# Patient Record
Sex: Female | Born: 1960 | Race: White | Hispanic: No | Marital: Single | Smoking: Current every day smoker
Health system: Southern US, Community
[De-identification: ages and names within clinical notes are randomized; demographics above are authoritative.]

## PROBLEM LIST (undated history)

## (undated) ENCOUNTER — Ambulatory Visit: Source: Home / Self Care

## (undated) DIAGNOSIS — Z9889 Other specified postprocedural states: Secondary | ICD-10-CM

## (undated) DIAGNOSIS — R3915 Urgency of urination: Secondary | ICD-10-CM

## (undated) DIAGNOSIS — Z87442 Personal history of urinary calculi: Secondary | ICD-10-CM

## (undated) DIAGNOSIS — M1990 Unspecified osteoarthritis, unspecified site: Secondary | ICD-10-CM

## (undated) DIAGNOSIS — T8859XA Other complications of anesthesia, initial encounter: Secondary | ICD-10-CM

## (undated) DIAGNOSIS — F319 Bipolar disorder, unspecified: Secondary | ICD-10-CM

## (undated) DIAGNOSIS — T4145XA Adverse effect of unspecified anesthetic, initial encounter: Secondary | ICD-10-CM

## (undated) DIAGNOSIS — Z973 Presence of spectacles and contact lenses: Secondary | ICD-10-CM

## (undated) DIAGNOSIS — Z85820 Personal history of malignant melanoma of skin: Secondary | ICD-10-CM

## (undated) DIAGNOSIS — F419 Anxiety disorder, unspecified: Secondary | ICD-10-CM

## (undated) DIAGNOSIS — E119 Type 2 diabetes mellitus without complications: Secondary | ICD-10-CM

## (undated) DIAGNOSIS — E785 Hyperlipidemia, unspecified: Secondary | ICD-10-CM

## (undated) DIAGNOSIS — N289 Disorder of kidney and ureter, unspecified: Secondary | ICD-10-CM

## (undated) DIAGNOSIS — I10 Essential (primary) hypertension: Secondary | ICD-10-CM

## (undated) DIAGNOSIS — Z960 Presence of urogenital implants: Secondary | ICD-10-CM

---

## 1993-08-08 HISTORY — PX: TUBAL LIGATION: SHX77

## 1993-08-08 HISTORY — PX: LUMBAR MICRODISCECTOMY: SHX99

## 1994-08-08 HISTORY — PX: CARPAL TUNNEL RELEASE: SHX101

## 2005-02-24 ENCOUNTER — Other Ambulatory Visit: Admission: RE | Admit: 2005-02-24 | Discharge: 2005-02-24 | Payer: Self-pay | Admitting: Internal Medicine

## 2005-03-15 ENCOUNTER — Other Ambulatory Visit: Admission: RE | Admit: 2005-03-15 | Discharge: 2005-03-15 | Payer: Self-pay | Admitting: Obstetrics and Gynecology

## 2005-05-13 ENCOUNTER — Emergency Department (HOSPITAL_COMMUNITY): Admission: EM | Admit: 2005-05-13 | Discharge: 2005-05-13 | Payer: Self-pay | Admitting: Family Medicine

## 2005-08-23 ENCOUNTER — Ambulatory Visit (HOSPITAL_COMMUNITY): Admission: RE | Admit: 2005-08-23 | Discharge: 2005-08-23 | Payer: Self-pay | Admitting: Internal Medicine

## 2005-09-01 ENCOUNTER — Emergency Department (HOSPITAL_COMMUNITY): Admission: EM | Admit: 2005-09-01 | Discharge: 2005-09-02 | Payer: Self-pay | Admitting: Emergency Medicine

## 2005-09-02 ENCOUNTER — Ambulatory Visit (HOSPITAL_COMMUNITY): Admission: RE | Admit: 2005-09-02 | Discharge: 2005-09-02 | Payer: Self-pay | Admitting: Dermatology

## 2005-09-05 ENCOUNTER — Ambulatory Visit (HOSPITAL_COMMUNITY): Admission: RE | Admit: 2005-09-05 | Discharge: 2005-09-05 | Payer: Self-pay | Admitting: Internal Medicine

## 2007-07-01 ENCOUNTER — Emergency Department (HOSPITAL_COMMUNITY): Admission: EM | Admit: 2007-07-01 | Discharge: 2007-07-01 | Payer: Self-pay | Admitting: Emergency Medicine

## 2008-02-17 ENCOUNTER — Other Ambulatory Visit: Payer: Self-pay | Admitting: Emergency Medicine

## 2008-02-18 ENCOUNTER — Other Ambulatory Visit: Payer: Self-pay | Admitting: Emergency Medicine

## 2008-02-18 ENCOUNTER — Ambulatory Visit: Payer: Self-pay | Admitting: Psychiatry

## 2008-02-18 ENCOUNTER — Inpatient Hospital Stay (HOSPITAL_COMMUNITY): Admission: AD | Admit: 2008-02-18 | Discharge: 2008-02-22 | Payer: Self-pay | Admitting: Psychiatry

## 2008-03-04 ENCOUNTER — Encounter: Admission: RE | Admit: 2008-03-04 | Discharge: 2008-03-04 | Payer: Self-pay | Admitting: Family Medicine

## 2008-12-05 ENCOUNTER — Emergency Department (HOSPITAL_COMMUNITY): Admission: EM | Admit: 2008-12-05 | Discharge: 2008-12-05 | Payer: Self-pay | Admitting: Emergency Medicine

## 2009-01-12 ENCOUNTER — Ambulatory Visit (HOSPITAL_COMMUNITY): Admission: RE | Admit: 2009-01-12 | Discharge: 2009-01-12 | Payer: Self-pay | Admitting: Obstetrics & Gynecology

## 2009-01-15 ENCOUNTER — Inpatient Hospital Stay (HOSPITAL_COMMUNITY): Admission: AD | Admit: 2009-01-15 | Discharge: 2009-01-15 | Payer: Self-pay | Admitting: Obstetrics & Gynecology

## 2009-02-06 ENCOUNTER — Inpatient Hospital Stay (HOSPITAL_COMMUNITY): Admission: RE | Admit: 2009-02-06 | Discharge: 2009-02-09 | Payer: Self-pay | Admitting: Obstetrics & Gynecology

## 2009-02-06 ENCOUNTER — Encounter: Payer: Self-pay | Admitting: Obstetrics & Gynecology

## 2009-02-06 HISTORY — PX: TOTAL ABDOMINAL HYSTERECTOMY W/ BILATERAL SALPINGOOPHORECTOMY: SHX83

## 2009-05-14 IMAGING — CT CT PELVIS W/ CM
2 of 5 series · 17 of 46 positions shown, 19 images · IV contrast (READICAT/WATER & [ID] OMNI [id number])
Comparison: None available

CT ABDOMEN

CLINICAL DATA: Low abdominal pain

CT ABDOMEN AND PELVIS WITH CONTRAST
TECHNIQUE: Multidetector CT imaging of the abdomen and pelvis was
performed using the standard protocol following bolus
administration of intravenous contrast.
Contrast: 125 ml Lmnipaque-KKK IV

[Series 3: routine abdomen · axial · 0.80mm/px · z∈[-410,+40]mm · 14 of 99 slices shown, 16 images]
[im 6/99  soft-tissue]
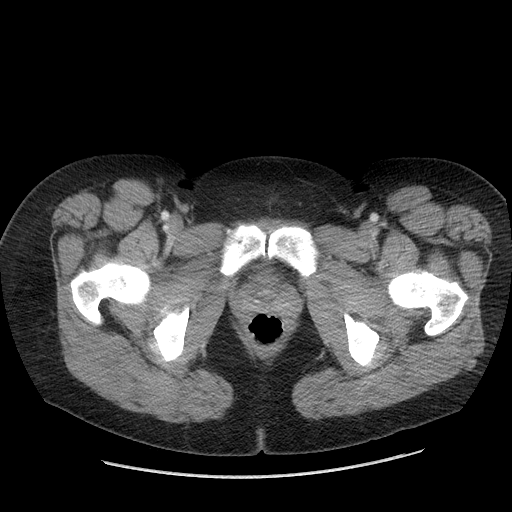
[im 6/99  bone]
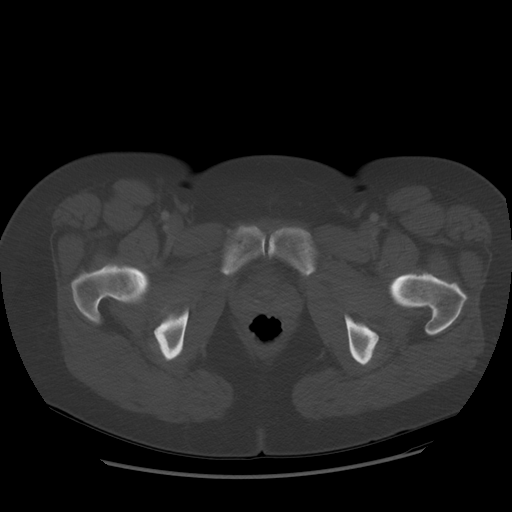
[im 11/99  soft-tissue]
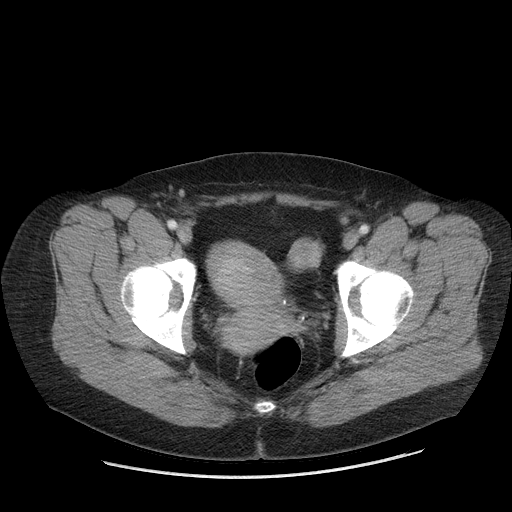
[im 21/99  soft-tissue]
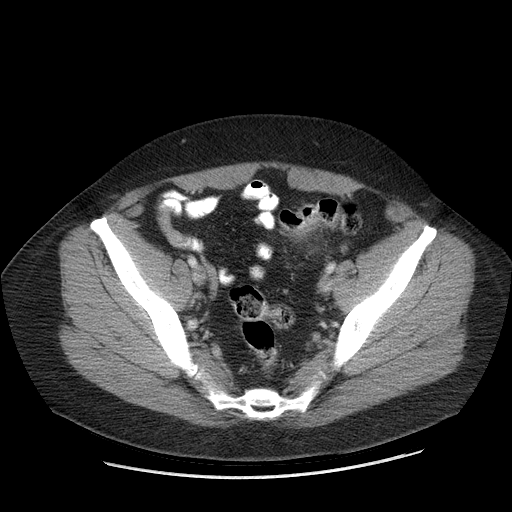
[im 26/99  soft-tissue]
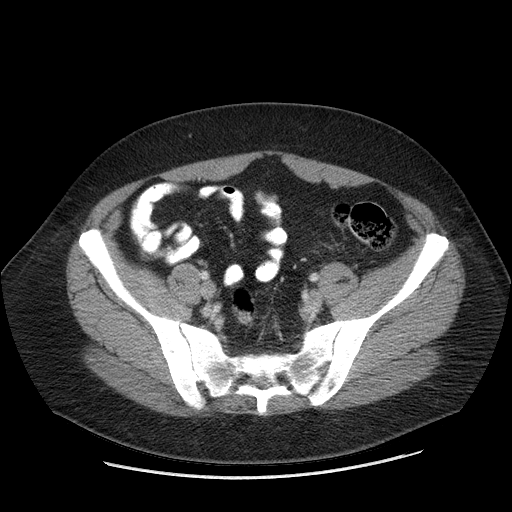
[im 31/99  soft-tissue]
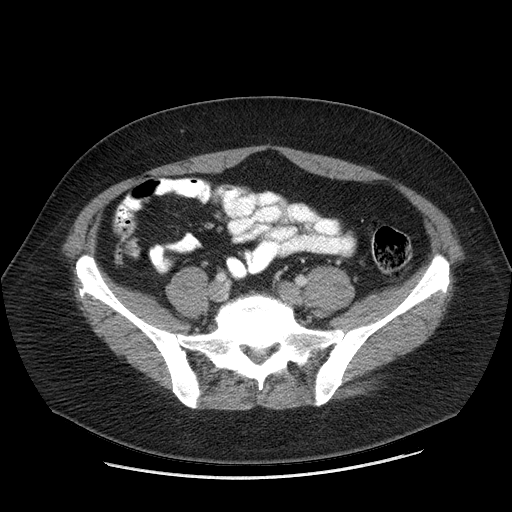
[im 42/99  soft-tissue]
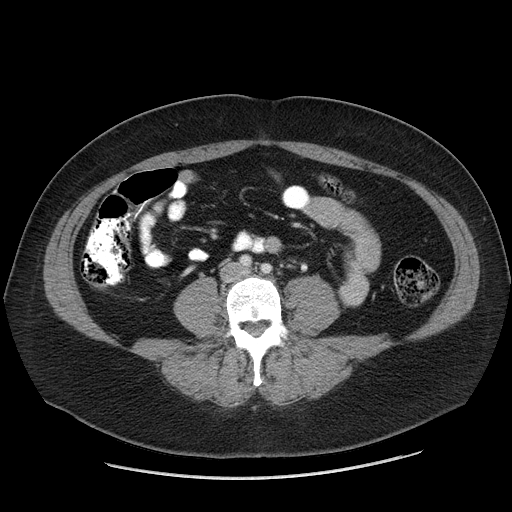
[im 47/99  soft-tissue]
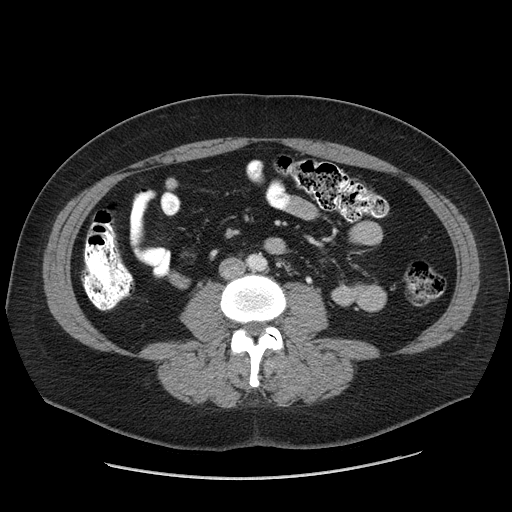
[im 52/99  soft-tissue]
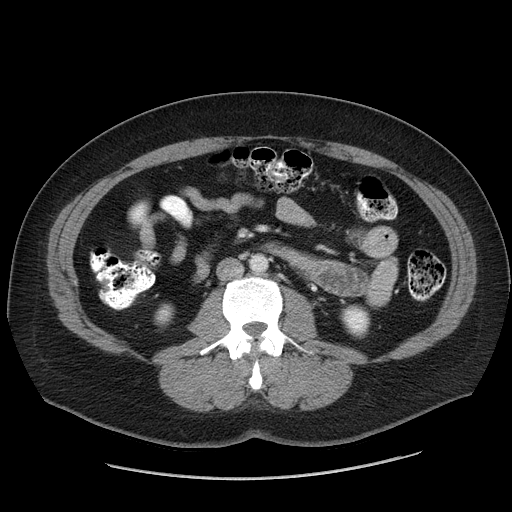
[im 57/99  soft-tissue]
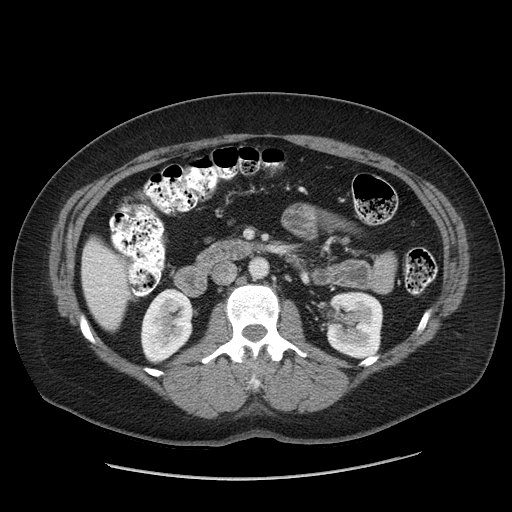
[im 57/99  bone]
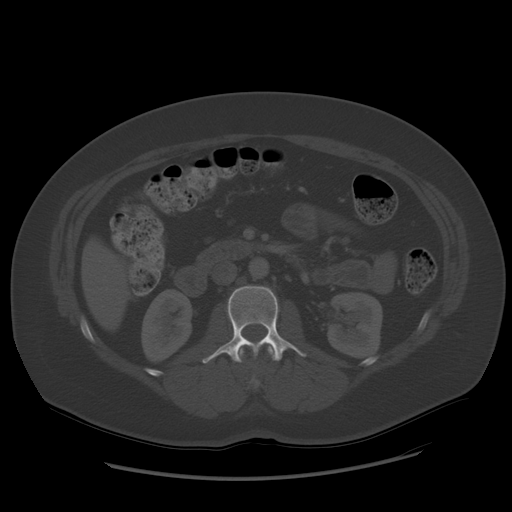
[im 68/99  soft-tissue]
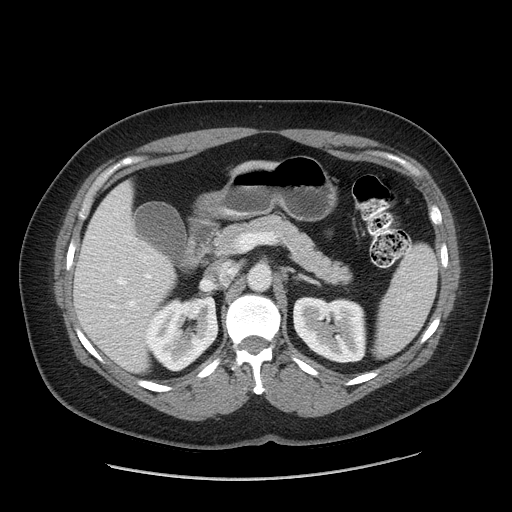
[im 73/99  soft-tissue]
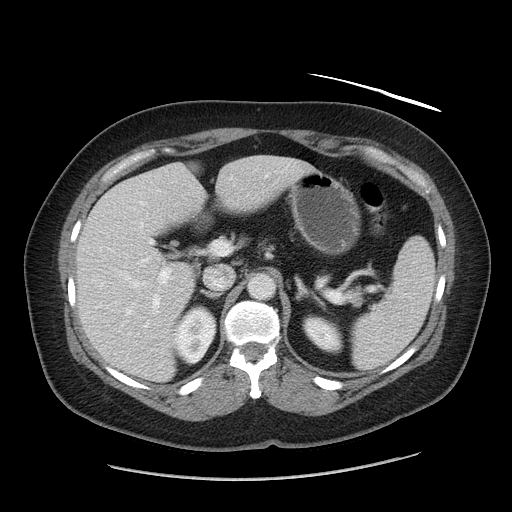
[im 78/99  soft-tissue]
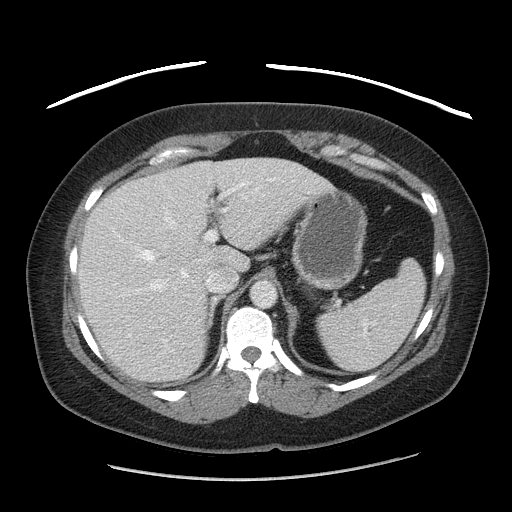
[im 88/99  soft-tissue]
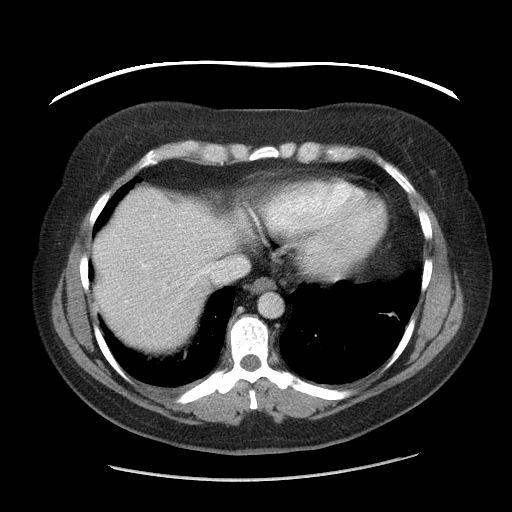
[im 93/99  soft-tissue]
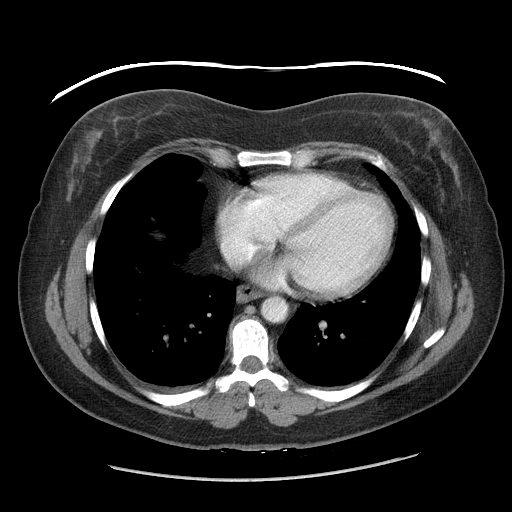

[Series 602: sagittal body · sagittal · 1.06mm/px · 3 of 165 slices shown]
[im 55/165  soft-tissue]
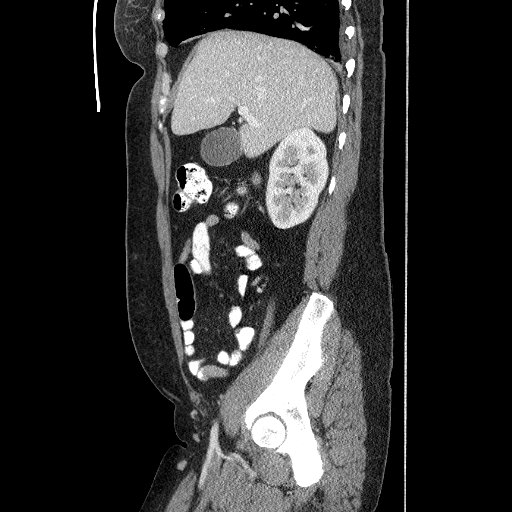
[im 73/165  soft-tissue]
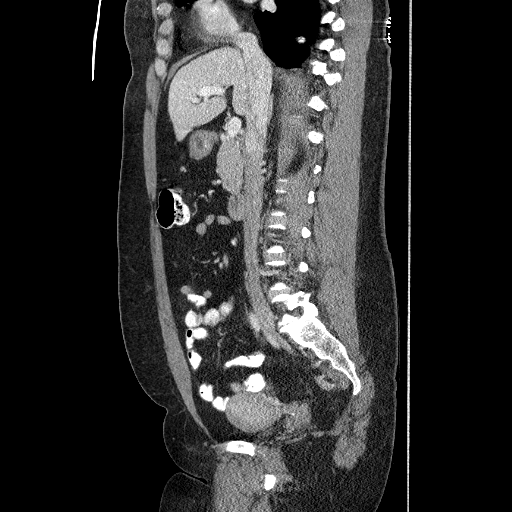
[im 92/165  soft-tissue]
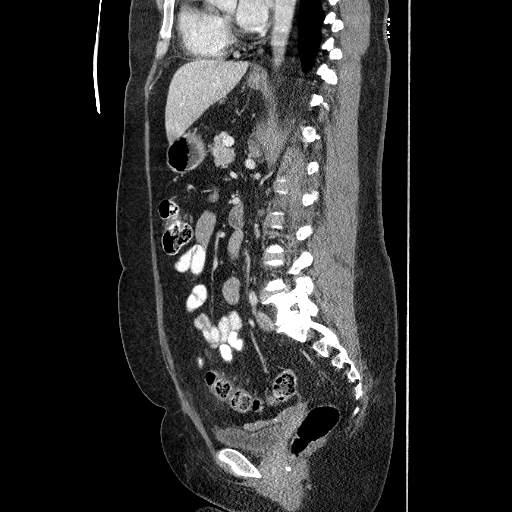

[17 of 46 positions shown; findings below may reference images not displayed]

FINDINGS: Minimal dependent atelectasis posteriorly in both lung
bases.  There are tiny bilateral pleural effusions.  Unremarkable
liver, gallbladder, spleen, adrenal glands, pancreas, kidneys,
aorta, small bowel.  No free air.  No ascites.  Portal vein patent.
A few sub centimeter left para-aortic and aortocaval lymph nodes
are noted, none appear pathologically enlarged by CT size criteria.
Degenerative changes at the lumbosacral junction.
IMPRESSION: 1.  Negative for acute abdominal process

CT PELVIS
FINDINGS: Normal appendix.  The colon is nondilated.
Inflammatory/edematous changes around the descending/sigmoid
junction.  The colon here is incompletely distended with some
asymmetric wall thickening.  No discrete extraluminal fluid
collection or gas.  The more distal sigmoid colon is nondistended,
unremarkable.  Urinary bladder is incompletely distended.  Uterus
and right adnexal region unremarkable.  2 left ovarian cysts are
noted.  No free fluid.  Small bilateral pelvic phleboliths.  No
adenopathy.
IMPRESSION: 1.  Probable diverticulitis at the descending/sigmoid junction
without evidence of abscess. Follow up recommended after resolution
to exclude mucosal lesion.
2.  Normal appendix

## 2009-05-18 ENCOUNTER — Emergency Department (HOSPITAL_COMMUNITY): Admission: EM | Admit: 2009-05-18 | Discharge: 2009-05-18 | Payer: Self-pay | Admitting: Emergency Medicine

## 2010-05-18 ENCOUNTER — Emergency Department (HOSPITAL_COMMUNITY): Admission: EM | Admit: 2010-05-18 | Discharge: 2010-05-18 | Payer: Self-pay | Admitting: Emergency Medicine

## 2010-11-14 LAB — TYPE AND SCREEN

## 2010-11-14 LAB — BASIC METABOLIC PANEL
BUN: 6 mg/dL (ref 6–23)
Calcium: 8 mg/dL — ABNORMAL LOW (ref 8.4–10.5)
GFR calc non Af Amer: >60 mL/min (ref >60)
Glucose, Bld: 139 mg/dL — ABNORMAL HIGH (ref 70–99)
Sodium: 137 mEq/L (ref 135–145)

## 2010-11-14 LAB — CBC
HCT: 38.1 % (ref 36.0–46.0)
Hemoglobin: 12.1 g/dL (ref 12.0–15.0)
Hemoglobin: 13.4 g/dL (ref 12.0–15.0)
Platelets: 176 10*3/uL (ref 150–400)
Platelets: 191 10*3/uL (ref 150–400)
RBC: 4.11 MIL/uL (ref 3.87–5.11)
RDW: 12.8 % (ref 11.5–15.5)
WBC: 7.8 10*3/uL (ref 4.0–10.5)

## 2010-11-14 LAB — PREGNANCY, URINE: Preg Test, Ur: NEGATIVE

## 2010-11-14 LAB — ABO/RH: ABO/RH(D): B POS

## 2010-11-15 LAB — CBC
Hemoglobin: 12.4 g/dL (ref 12.0–15.0)
RBC: 3.72 MIL/uL — ABNORMAL LOW (ref 3.87–5.11)

## 2010-11-15 LAB — SAMPLE TO BLOOD BANK

## 2010-11-15 LAB — GC/CHLAMYDIA PROBE AMP, GENITAL: GC Probe Amp, Genital: NEGATIVE

## 2010-11-15 LAB — WET PREP, GENITAL
Trich, Wet Prep: NONE SEEN
Yeast Wet Prep HPF POC: NONE SEEN

## 2010-12-21 NOTE — H&P (Signed)
[name], [name]                  ACCOUNT NO.:  [id number]   MEDICAL RECORD NO.:  [id number]          PATIENT TYPE:  MAT   LOCATION:  MATC                          FACILITY:  [facility]   PHYSICIAN:  Roseanna Rainbow, M.D.DATE OF BIRTH:  09-Jul-1961   DATE OF ADMISSION:  01/15/2009  DATE OF DISCHARGE:  01/15/2009                              HISTORY & PHYSICAL   CHIEF COMPLAINT:  The patient is a 50 year old, complaining of abnormal  uterine bleeding.  She has also been previously diagnosed with  endometriosis and has associated pelvic pain.  She presents for a total  abdominal hysterectomy and bilateral salpingo-oophorectomy.   HISTORY OF PRESENT ILLNESS:  The patient reports menstrual cycles at 3-4  months intervals.  She also reports prolonged menses 2-3 weeks in  duration.  She reports associated heavy flow with blood clots.  She has  a remote history of a laparoscopy at which point, per the patient's  report she was diagnosed with endometriosis.  She also complains of  dyspareunia.  Workup to date included an endometrial biopsy and an  ultrasound which showed a possible small fundal myoma and a right simple  appearing 3 cm ovarian cyst. GC and chlamydia probes were negative on  June 2 as well as a CA-125 was within normal limits.  A hemoglobin was  12.8, a prolactin was 7.5 and a TSH was within normal limits.   ALLERGIES:  No known drug allergies.   MEDICATIONS:  Paxil, Neurontin, Lamictal, trazodone.   PAST MEDICAL HISTORY:  Depression, hypercholesterolemia.   PAST SURGICAL HISTORY:  Laparoscopy, back surgery, hand surgery, skin  lesion removal excision.   SOCIAL HISTORY:  She is divorced, living with her significant other.  She is a Consulting civil engineer.  She currently smokes one pack a day and has smoked  for 30-40 years.  She drinks a minimal amount of alcohol.   FAMILY HISTORY:  Remarkable for Alzheimer disease, asthma, arthritis,  lung cancer, myocardial infarction, COPD,  depression, adult-onset  diabetes, gout, hypertension, migraine headaches.   PAST GYN HISTORY:  Please see the above.  Last Pap smear was earlier in  the year and was normal.  She has a history of a cesarean delivery and  tubal ligation.   REVIEW OF SYSTEMS:  GU:  Please see the above.  GENERAL:  She reports a  recent weight gain.   PHYSICAL EXAMINATION:  VITAL SIGNS:  Temperature 97.9, pulse 72, blood  pressure 112/72, weight 242 pounds.  GENERAL:  A well-developed, well-nourished, no apparent distress.  LUNGS:  Clear to auscultation bilaterally.  HEART:  Regular rate and rhythm.  ABDOMEN:  Nontender.  No organomegaly.  No hernias.  Pelvic exam normal  exit, vagina is clean.  The uterus is nontender, is of  normal size,  shape and consistency.  Position and ability are normal.  On exam of the  adnexa there is bilateral adnexal tenderness appreciated.  No discrete  masses noted.   ASSESSMENT:  Dysfunctional uterine bleeding likely perimenopausal also  with a chronic pelvic pain syndrome, previously diagnosed with  endometriosis.   PLAN:  The planned procedure is a total abdominal hysterectomy and  bilateral salpingo-oophorectomy.  The risks, benefits and alternatives,  and forms of management were reviewed with the patient and informed  consent has been obtained.      Roseanna Rainbow, M.D.  Electronically Signed     LAJ/MEDQ  D:  02/05/2009  T:  02/06/2009  Job:  [id number]

## 2010-12-21 NOTE — Op Note (Signed)
NAME:  [name], [name]                  ACCOUNT NO.:  [id number]   MEDICAL RECORD NO.:  [id number]          PATIENT TYPE:  INP   LOCATION:  9373                          FACILITY:  [facility]   PHYSICIAN:  Roseanna Rainbow, M.D.DATE OF BIRTH:  March 25, 1961   DATE OF PROCEDURE:  02/06/2009  DATE OF DISCHARGE:                               OPERATIVE REPORT   PREOPERATIVE DIAGNOSES:  Chronic pelvic pain, abnormal uterine bleeding,  uterine fibroids, right-sided ovarian cyst.   POSTOPERATIVE DIAGNOSES:  Chronic pelvic pain, abnormal uterine  bleeding, uterine fibroids, right-sided ovarian cyst.   PROCEDURE:  Total abdominal hysterectomy and bilateral salpingo-  oophorectomy.   SURGEON:  Roseanna Rainbow, MD   ASSISTANT:  Bing Neighbors. Clearance Coots, MD   ANESTHESIA:  General endotracheal.   ESTIMATED BLOOD LOSS:  200 mL.   SURGICAL FINDINGS:  At that time of exploratory laparotomy, there was a  small fundal myoma.  There was a hydrosalpinx noted on the right side  distal to the previous ligation procedure.  There was a simple right-  sided ovarian cyst that was ruptured intraoperatively.  During the  procedure the patient demonstrated generalized tonic-clonic movement.  These were episodic and brief in duration lasting less than 1 minute.   PROCEDURE:  The patient was taken to the operating room.  She was given  general anesthesia and placed in a modified lithotomy position with an  Allis stirrup.  The abdomen was prepped and draped in the usual sterile  fashion.  After a time-out had been completed, the abdomen was entered  through the previous transverse skin incision.  The abdomen and pelvis  were explored with the above-noted findings.  An O'Connor-O'Sullivan  retractor was placed into the incision.  The bowel was packed away with  moist laparotomy sponges.  The uterus was grasped with 2 Lizarraga Kelly  clamps.  The round ligaments were divided.  The retroperitoneal spaces  was opened  identifying the ureters.  The ovarian pedicles were  skeletonized, clamped, cut, free tied, and suture-ligated.  The bladder  flap was then advanced using sharp and blunt dissection.  The uterine  vessels were skeletonized, clamped, cut, and suture-ligated.  In a step-  wise fashion, the cardinal ligaments were clamped, cut, and suture-  ligated.  The vaginal angles were cross clamped and the vagina was  transected from the cervix.  The vaginal angles were transfixed with 0  Vicryl sutures.  The remainder of the vaginal cuff was closed with  interrupted sutures of 0 Vicryl.  The pelvis was irrigated and found to  be hemostatic.  The retractor and packs were then removed from the  abdomen.  The parietal peritoneum was then reapproximated in a running  fashion using a suture of 2-0 Vicryl.  The fascia was reapproximated  with 2  running sutures of 0 PDS tied in the midline.  The skin was closed in a  subcuticular fashion using a running suture of 3-0 Monocryl.  At the  close of the procedure, the instrument and pack counts were said to be  correct x2.  The  patient was taken to the PACU awake and in stable  condition.      Roseanna Rainbow, M.D.  Electronically Signed     LAJ/MEDQ  D:  02/06/2009  T:  02/07/2009  Job:  [id number]

## 2010-12-24 NOTE — Discharge Summary (Signed)
[name], [name]                  ACCOUNT NO.:  [id number]   MEDICAL RECORD NO.:  [id number]          PATIENT TYPE:  IPS   LOCATION:  0307                          FACILITY:  [facility]   PHYSICIAN:  Geoffery Lyons, M.D.      DATE OF BIRTH:  [birth year]   DATE OF ADMISSION:  02/18/2008  DATE OF DISCHARGE:  02/22/2008                               DISCHARGE SUMMARY   CHIEF COMPLAINT:  This was the first admission to [facility] for this 50 year old female voluntarily admitted.  Was fired from  her job.  Endorsed increased depression and being tearful, wanted to end  her life. Had been tearful with decreased energy, sleeping 13 hours a  night, diagnosed bipolar 2 years prior to this admission.  Mood  fluctuation throughout the day. No appetite, increased crying, suicidal  thoughts, drinking about a case a week.   PAST PSYCHIATRIC HISTORY:  Dr. Betti Cruz in the past.  No current care.  [facility]. Previously diagnosed bipolar on Lamictal,  took up to 100 mg per day. Two prior suicide attempts age 66 and in 43  by cutting her wrist and overdose in 1997.   SECONDARY HISTORY:  As already stated, persistent use of alcohol.   MEDICAL HISTORY:  Noncontributory.   MEDICATION:  1. Paxil 20 mg per day.  2. Klonopin 1 mg two at night.   PHYSICAL EXAMINATION:  Exam failed to show any acute findings.   LABORATORY WORK:  TSH 2.141.  Other labs not available in the chart.   MENTAL STATUS EXAM:  Reveals a fully alert, cooperative female with  depressed affect, depressed, tearful.  Thought processes logical,  coherent and relevant.  Endorsed suicidal ruminations. No active plan.  No homicidal ideas, no delusions.  No hallucinations.  Cognition well-  preserved.   ADMITTING DIAGNOSES:  AXIS I: Bipolar disorder, depressed. Alcohol  disorder.  AXIS II: No diagnosis.  AXIS III:  No diagnosis.  AXIS IV: Moderate.  AXIS V:  Upon admission 35-40, highest GAF in the last  year 60.   COURSE IN THE HOSPITAL:  She was admitted.  She was started individual  and group psychotherapy.  We detoxified with Librium. She was placed on  Paxil and Lamictal. As already stated, endorsed she got depressed.  She  claimed that she kept her boss lady from driving while intoxicated.  Endorsed that she can be up in the morning then she goes down.  She can  relate that for 2 or 3 days, decreased appetite, increased sleep,  crying, decreased energy, decreased motivation.  She was diagnosed 2  years ago via Dr. Betti Cruz as bipolar. Had also seen Dr. Elberta Spaniel for  counseling. Attempted suicide at the age 27 and then in 50. Living  with the boyfriend. Strong family history of depression.  July 15, she  had not been sleeping well. She said she was up in the middle of the  night.  Gets up for a smoke and smokes a cigarette and then goes back.  Also going to AA.  She was  admitted that she had problems with alcohol.  Endorsed that she was committed to abstinence. Endorsed that she would  change jobs. 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 July 16, she wanted help with the anxiety if  she is not going to able to get the benzodiazepines.  Endorsed that  panicky feeling. We tried on Neurontin. Family session with boyfriend.  She was endorsing that she wanted full abstinence. July 17 she was in  full contact reality.  Fully detoxed. No suicidal  ideas, no  hallucinations or delusions.  Mood improved.  Affect brighter,  tolerating medication well.   DISCHARGE DIAGNOSES:  AXIS I: Bipolar disorder, anxiety disorder, not  otherwise specified. Alcohol abuse.  AXIS II: No diagnosis.  AXIS III:  No diagnosis.  AXIS IV: Moderate.  AXIS V:  Upon discharge 55-60.   Discharged on Lamictal 25 one daily for 10 more days then two daily.  Paxil 30 mg per day, Neurontin 100 three times a day, BuSpar 15 one-half  twice a day and trazodone 100 one-half  to one  at bedtime for sleep.  Follow with Darryll Capers and Silver Huguenin.      Geoffery Lyons, M.D.  Electronically Signed     IL/MEDQ  D:  03/19/2008  T:  03/19/2008  Job:  [id number]

## 2011-05-05 LAB — DIFFERENTIAL
Basophils Relative: 1
Eosinophils Absolute: 0
Monocytes Absolute: 0.4
Monocytes Relative: 5
Neutrophils Relative %: 45

## 2011-05-05 LAB — URINALYSIS, ROUTINE W REFLEX MICROSCOPIC
Glucose, UA: NEGATIVE
Hgb urine dipstick: NEGATIVE
Ketones, ur: NEGATIVE
Protein, ur: NEGATIVE

## 2011-05-05 LAB — COMPREHENSIVE METABOLIC PANEL
AST: 20
Albumin: 3.7
Chloride: 103
Creatinine, Ser: 0.8
GFR calc Af Amer: >60
Potassium: 3.6
Total Bilirubin: 0.6
Total Protein: 6.6

## 2011-05-05 LAB — ETHANOL: Alcohol, Ethyl (B): 42 — ABNORMAL HIGH

## 2011-05-05 LAB — CBC
MCV: 89.6
Platelets: 196
WBC: 7

## 2011-05-05 LAB — RAPID URINE DRUG SCREEN, HOSP PERFORMED
Amphetamines: NOT DETECTED
Benzodiazepines: NOT DETECTED

## 2011-05-05 LAB — T4, FREE: Free T4: 1.01

## 2012-09-10 ENCOUNTER — Emergency Department: Payer: Self-pay | Admitting: Emergency Medicine

## 2012-10-29 ENCOUNTER — Emergency Department (HOSPITAL_COMMUNITY)
Admission: EM | Admit: 2012-10-29 | Discharge: 2012-10-29 | Disposition: A | Discharge status: Home / Self Care | Payer: Self-pay | Attending: Emergency Medicine | Admitting: Emergency Medicine

## 2012-10-29 ENCOUNTER — Encounter (HOSPITAL_COMMUNITY): Payer: Self-pay | Admitting: Emergency Medicine

## 2012-10-29 ENCOUNTER — Emergency Department (HOSPITAL_COMMUNITY): Payer: Self-pay

## 2012-10-29 DIAGNOSIS — S8263XA Displaced fracture of lateral malleolus of unspecified fibula, initial encounter for closed fracture: Secondary | ICD-10-CM | POA: Insufficient documentation

## 2012-10-29 DIAGNOSIS — Z79899 Other long term (current) drug therapy: Secondary | ICD-10-CM | POA: Insufficient documentation

## 2012-10-29 DIAGNOSIS — S8262XA Displaced fracture of lateral malleolus of left fibula, initial encounter for closed fracture: Secondary | ICD-10-CM

## 2012-10-29 DIAGNOSIS — Y939 Activity, unspecified: Secondary | ICD-10-CM | POA: Insufficient documentation

## 2012-10-29 DIAGNOSIS — Y9289 Other specified places as the place of occurrence of the external cause: Secondary | ICD-10-CM | POA: Insufficient documentation

## 2012-10-29 DIAGNOSIS — W010XXA Fall on same level from slipping, tripping and stumbling without subsequent striking against object, initial encounter: Secondary | ICD-10-CM | POA: Insufficient documentation

## 2012-10-29 MED ORDER — OXYCODONE-ACETAMINOPHEN 5-325 MG PO TABS: 1.0000 | ORAL_TABLET | ORAL | Status: DC | PRN

## 2012-10-29 NOTE — ED Notes (Signed)
Pt states that 6 wks ago she hurt her lt ankle and that she had it x ray with no seen fracture and states that since then she has contiued to have swelling and pain to ankle that she can not put wt onto the ankle. Swelling to lt exterior ankle. Minimal wt on foot.

## 2012-10-29 NOTE — ED Provider Notes (Signed)
History    This chart was scribed for non-physician practitioner working with Celene Kras, MD by ED Scribe, Burman Nieves. This patient was seen in room WTR9/WTR9 and the patient's care was started at 4:22 PM.   CSN: [id number]  Arrival date & time 10/29/12  1522   First MD Initiated Contact with Patient 10/29/12 1622      Chief Complaint  Patient presents with  . Ankle Injury    (Consider location/radiation/quality/duration/timing/severity/associated sxs/prior treatment) HPI [name] is a 52 y.o. female who presents to the Emergency Department complaining of moderate constant left ankle pain onset 6 weeks ago. Pt states she was drinking and tripped over the dogs outside resulting in tumbling down the steps injuring her left ankle. Pt was woke up the next day and it was swollen and she could barely walk on it. Pt went to get her ankle looked at and an x-ray was taken but nothing was found. Pt states she tried a brace for her ankle and it seemed to exacerbate the pain. Pt is able to ambulate on the left ankle in the ED today. Pt states a 7/10 on a pain scale and mild swelling is evident in left ankle. Pt states she takes 4 ibuprofen for the pain which mildly takes the edge off. Pt denies LOC, HI, fever, chills, cough, nausea, vomiting, SOB, weakness, numbness and any other associated symptoms.   History reviewed. No pertinent past medical history.  History reviewed. No pertinent past surgical history.  No family history on file.  History  Substance Use Topics  . Smoking status: Not on file  . Smokeless tobacco: Not on file  . Alcohol Use: Not on file    OB History   Grav Para Term Preterm Abortions TAB SAB Ect Mult Living                  Review of Systems  Constitutional: Negative for fever and diaphoresis.  HENT: Negative for neck pain and neck stiffness.   Eyes: Negative for visual disturbance.  Respiratory: Negative for apnea, chest tightness and shortness of breath.    Cardiovascular: Negative for chest pain and palpitations.  Gastrointestinal: Negative for nausea, vomiting, diarrhea and constipation.  Genitourinary: Negative for dysuria.  Musculoskeletal: Positive for joint swelling, arthralgias and gait problem.       Left ankle swelling. Able to ambulate but painful. Pt has been bearing weight on extremity for 6 weeks s/p injury  Skin: Negative for color change.  Neurological: Negative for dizziness, weakness, light-headedness, numbness and headaches.    Allergies  Review of patient's allergies indicates no known allergies.  Home Medications   Current Outpatient Rx  Name  Route  Sig  Dispense  Refill  . clonazePAM (KLONOPIN) 1 MG tablet   Oral   Take 1 mg by mouth 2 (two) times daily as needed for anxiety.         Marland Kitchen PARoxetine (PAXIL) 40 MG tablet   Oral   Take 40 mg by mouth every morning.           There were no vitals taken for this visit.  Physical Exam  Nursing note and vitals reviewed. Constitutional: She is oriented to person, place, and time. She appears well-developed and well-nourished. No distress.  HENT:  Head: Normocephalic and atraumatic.  Eyes: Conjunctivae and EOM are normal.  Neck: Normal range of motion. Neck supple.  No meningeal signs  Cardiovascular: Normal rate, regular rhythm, normal heart sounds and intact  distal pulses.   Good pedal pulse on left foot.  Pulmonary/Chest: Effort normal and breath sounds normal. She has no wheezes.  Abdominal: Soft. Bowel sounds are normal. There is no tenderness.  Musculoskeletal: Normal range of motion. She exhibits edema and tenderness.  ROM intact for injured extremity, though painful. Pedal pulse intact. Strength diminished due to pain. Tender to palpation  Neurological: She is alert and oriented to person, place, and time. No cranial nerve deficit.  No focal deficits, sensation to light touch intact.   Skin: Skin is warm and dry. She is not diaphoretic. No erythema.   No skin discoloration  Psychiatric: She has a normal mood and affect.    ED Course  Procedures (including critical care time)  COORDINATION OF CARE:  5:08 PM Discussed ED treatment with pt and pt agrees.    Labs Reviewed - No data to display Dg Ankle Complete Left  10/29/2012  *RADIOLOGY REPORT*  Clinical Data: Injury, fall, pain  LEFT ANKLE COMPLETE - 3+ VIEW  Comparison: None.  Findings: Normal alignment without joint effusion.  Tiny ossified fragment along the lateral malleolar tip could represent a small lateral avulsion fracture.  Distal tibia, talus and calcaneus appear intact.  Talar dome unremarkable.  IMPRESSION: Question small lateral malleolar tip avulsion fracture.   Original Report Authenticated By: Judie Petit. Shick, M.D.      1. Ankle fracture, lateral malleolus, closed, left, initial encounter       MDM  Imaging shows possible small lateral malleolar tip avulsion fx. Due to length of time of injury, suggest pt follow up with ortho (provided contact info for Dr. Sherlean Foot). Cam walker applied and crutches provided.  At this time there does not appear to be any evidence of an acute emergency medical condition and the patient appears stable for discharge with appropriate outpatient follow up. Discussed use of ibuprofen for pain and prescribed percocet for breakthrough pain. Diagnosis was discussed with patient who verbalizes understanding and is agreeable to discharge.   I personally performed the services described in this documentation, which was scribed in my presence. The recorded information has been reviewed and is accurate.    Glade Nurse, PA-C 10/30/12 1431

## 2012-10-30 NOTE — ED Provider Notes (Signed)
Medical screening examination/treatment/procedure(s) were performed by non-physician practitioner and as supervising physician I was immediately available for consultation/collaboration.   Shrita Thien R Emilija Bohman, MD 10/30/12 1627 

## 2015-02-18 ENCOUNTER — Other Ambulatory Visit (HOSPITAL_COMMUNITY): Payer: Self-pay | Admitting: Internal Medicine

## 2015-02-18 DIAGNOSIS — Z1231 Encounter for screening mammogram for malignant neoplasm of breast: Secondary | ICD-10-CM

## 2015-02-19 ENCOUNTER — Ambulatory Visit (HOSPITAL_COMMUNITY)
Admission: RE | Admit: 2015-02-19 | Discharge: 2015-02-19 | Disposition: A | Discharge status: Home / Self Care | Payer: Self-pay | Source: Ambulatory Visit | Attending: Internal Medicine | Admitting: Internal Medicine

## 2015-02-19 DIAGNOSIS — Z1231 Encounter for screening mammogram for malignant neoplasm of breast: Secondary | ICD-10-CM

## 2015-08-22 ENCOUNTER — Emergency Department (HOSPITAL_COMMUNITY): Payer: Self-pay

## 2015-08-22 ENCOUNTER — Encounter (HOSPITAL_COMMUNITY): Payer: Self-pay | Admitting: Emergency Medicine

## 2015-08-22 ENCOUNTER — Emergency Department (HOSPITAL_COMMUNITY)
Admission: EM | Admit: 2015-08-22 | Discharge: 2015-08-22 | Disposition: A | Discharge status: Home / Self Care | Payer: Self-pay | Attending: Emergency Medicine | Admitting: Emergency Medicine

## 2015-08-22 DIAGNOSIS — Z79899 Other long term (current) drug therapy: Secondary | ICD-10-CM | POA: Insufficient documentation

## 2015-08-22 DIAGNOSIS — F17210 Nicotine dependence, cigarettes, uncomplicated: Secondary | ICD-10-CM | POA: Insufficient documentation

## 2015-08-22 DIAGNOSIS — Z8639 Personal history of other endocrine, nutritional and metabolic disease: Secondary | ICD-10-CM | POA: Insufficient documentation

## 2015-08-22 DIAGNOSIS — R079 Chest pain, unspecified: Secondary | ICD-10-CM | POA: Insufficient documentation

## 2015-08-22 LAB — CBC
HEMATOCRIT: 42.6 % (ref 36.0–46.0)
HEMOGLOBIN: 15 g/dL (ref 12.0–15.0)
MCH: 30.6 pg (ref 26.0–34.0)
MCHC: 35.2 g/dL (ref 30.0–36.0)
MCV: 86.9 fL (ref 78.0–100.0)
Platelets: 203 10*3/uL (ref 150–400)
RBC: 4.9 MIL/uL (ref 3.87–5.11)
RDW: 12.3 % (ref 11.5–15.5)
WBC: 6.6 10*3/uL (ref 4.0–10.5)

## 2015-08-22 LAB — BASIC METABOLIC PANEL
Anion gap: 11 (ref 5–15)
BUN: 14 mg/dL (ref 6–20)
CHLORIDE: 103 mmol/L (ref 101–111)
CO2: 25 mmol/L (ref 22–32)
Calcium: 9.2 mg/dL (ref 8.9–10.3)
Creatinine, Ser: 0.62 mg/dL (ref 0.44–1.00)
GFR calc Af Amer: >60 mL/min (ref >60)
GFR calc non Af Amer: >60 mL/min (ref >60)
Glucose, Bld: 120 mg/dL — ABNORMAL HIGH (ref 65–99)
POTASSIUM: 3.3 mmol/L — AB (ref 3.5–5.1)
SODIUM: 139 mmol/L (ref 135–145)

## 2015-08-22 LAB — I-STAT TROPONIN, ED
TROPONIN I, POC: 0 ng/mL (ref 0.00–0.08)
Troponin i, poc: 0.01 ng/mL (ref 0.00–0.08)

## 2015-08-22 MED ORDER — IBUPROFEN 200 MG PO TABS
600.0000 mg | ORAL_TABLET | Freq: Once | ORAL | Status: AC
  Administered 2015-08-22: 600 mg via ORAL
  Filled 2015-08-22: qty 1

## 2015-08-22 MED ORDER — HYDROCODONE-ACETAMINOPHEN 5-325 MG PO TABS
1.0000 | ORAL_TABLET | Freq: Once | ORAL | Status: AC
  Administered 2015-08-22: 1 via ORAL
  Filled 2015-08-22: qty 1

## 2015-08-22 NOTE — ED Notes (Signed)
Pt reports pain in her left chest which began in her axilla and then radiated to her arm and neck starting last night. Pt alert x4. NAD at this time

## 2015-08-22 NOTE — ED Provider Notes (Signed)
CSN: [id number]     Arrival date & time 08/22/15  1256 History   First MD Initiated Contact with Patient 08/22/15 1459     Chief Complaint  Patient presents with  . Chest Pain     (Consider location/radiation/quality/duration/timing/severity/associated sxs/prior Treatment) Patient is a 55 y.o. female presenting with chest pain.  Chest Pain Pain location:  L chest and L lateral chest Pain quality: dull   Pain radiates to:  L jaw Pain radiates to the back: no   Duration:  12 hours Timing:  Constant Progression:  Waxing and waning Relieved by:  None tried Worsened by:  Nothing tried Ineffective treatments:  None tried Associated symptoms: no abdominal pain, no cough, no fever, no nausea, no shortness of breath, not vomiting and no weakness   Risk factors: no aortic disease, no coronary artery disease and no diabetes mellitus        Past Medical History  Diagnosis Date  . High cholesterol    Past Surgical History  Procedure Laterality Date  . Abdominal hysterectomy    . Carpal tunnel release Right   . Melanoma removal    . Cesarean section     No family history on file. Social History  Substance Use Topics  . Smoking status: Current Every Day Smoker -- 0.50 packs/day    Types: Cigarettes  . Smokeless tobacco: None  . Alcohol Use: No   OB History    No data available     Review of Systems  Constitutional: Negative for fever and chills.  HENT: Negative for nosebleeds.   Eyes: Negative for visual disturbance.  Respiratory: Negative for cough and shortness of breath.   Cardiovascular: Positive for chest pain.  Gastrointestinal: Negative for nausea, vomiting, abdominal pain, diarrhea and constipation.  Genitourinary: Negative for dysuria.  Skin: Negative for rash.  Neurological: Negative for weakness.  All other systems reviewed and are negative.     Allergies  Review of patient's allergies indicates no known allergies.  Home Medications   Prior to  Admission medications   Medication Sig Start Date End Date Taking? Authorizing Provider  clonazePAM (KLONOPIN) 1 MG tablet Take 2 mg by mouth at bedtime.    Yes Historical Provider, MD  ibuprofen (ADVIL,MOTRIN) 200 MG tablet Take 1,000 mg by mouth daily as needed for moderate pain.   Yes Historical Provider, MD  oxyCODONE-acetaminophen (PERCOCET) 5-325 MG per tablet Take 1 tablet by mouth every 4 (four) hours as needed for pain. 10/29/12  Yes Lowell BoutonBarbara A Beck, PA-C  PARoxetine (PAXIL) 40 MG tablet Take 40 mg by mouth every morning.   Yes Historical Provider, MD   BP 150/85 mmHg  Pulse 73  Temp(Src) 98.2 F (36.8 C) (Oral)  Resp 13  SpO2 97% Physical Exam  Constitutional: She is oriented to person, place, and time. No distress.  HENT:  Head: Normocephalic and atraumatic.  Eyes: EOM are normal. Pupils are equal, round, and reactive to light.  Neck: Normal range of motion. Neck supple.  Cardiovascular: Normal rate and intact distal pulses.   Pulmonary/Chest: No respiratory distress. She exhibits tenderness (L).  Abdominal: Soft. There is no tenderness.  Musculoskeletal: Normal range of motion.  Neurological: She is alert and oriented to person, place, and time.  Skin: No rash noted. She is not diaphoretic.  Psychiatric: She has a normal mood and affect.    ED Course  Procedures (including critical care time) Labs Review Labs Reviewed  BASIC METABOLIC PANEL - Abnormal; Notable for the following:  Potassium 3.3 (*)    Glucose, Bld 120 (*)    All other components within normal limits  CBC  I-STAT TROPOININ, ED  I-STAT TROPOININ, ED  [name], ED    Imaging Review Dg Chest 2 View  08/22/2015  CLINICAL DATA:  Left chest and arm pain EXAM: CHEST  2 VIEW COMPARISON:  09/02/2005 FINDINGS: Borderline cardiomegaly. Clear lungs. Normal vascularity. No pleural effusion or pneumothorax. IMPRESSION: No active cardiopulmonary disease. Electronically Signed   By: Jolaine Click M.D.    On: 08/22/2015 14:03   I have personally reviewed and evaluated these images and lab results as part of my medical decision-making.   EKG Interpretation   Date/Time:  Saturday August 22 2015 13:11:17 EST Ventricular Rate:  78 PR Interval:  154 QRS Duration: 86 QT Interval:  390 QTC Calculation: 444 R Axis:   41 Text Interpretation:  Normal sinus rhythm Low voltage QRS Borderline ECG  Confirmed by RAY MD, Duwayne Heck ([id number]) on 08/22/2015 4:21:58 PM      MDM   Final diagnoses:  Chest pain, unspecified chest pain type    77 y F w PMH HL, smoking, who comes in with chest pain that started last night.  The chest pain started at rest, is left sided, pressure like, radiates to the L shoulder and neck.  The chest pain is reproducible to palpation by the patient and on my exam.  Patient has no h/o CAD.    Exam as above.  ekg w/o ischemic changes.  Feel that she is relatively low risk for acs given her relatively young age and no cardiac hx.  Also, her chest pain is easily reproducible on exam.  Labs including cbc/bmp unremarkable.  cxr unremarkable.  Trop neg. Doubt PE given no tachycardia, no hypoxia, no leg swelling, no previous dvt/pe.  Will obtain delta trop.  HEART score of 3.   Will plan for outpatient stress test if delta trop neg.  Delta trop neg.  Will d/c with pcp f/u  I have discussed the results, Dx and Tx plan with the pt. They expressed understanding and agree with the plan and were told to return to ED with any worsening of condition or concern.    Disposition: Discharge  Condition: Good  Discharge Medication List as of 08/22/2015  5:59 PM      Follow Up: Ralene Ok, MD [id number] [place] [address] [phone]  In 2 days    Pt seen in conjunction with Dr. Sheppard Coil, MD 08/22/15 1027  Margarita Grizzle, MD 08/22/15 2358

## 2015-08-22 NOTE — Discharge Instructions (Signed)

## 2015-08-22 NOTE — ED Provider Notes (Signed)
55 y.o. Female with left axilla pain for 12 hours.  Pain is reproducible with palpation to lateral left breast.  Pain improved with nsaid and percocet.  Patient hemodynamically stable here.  EKG nonischemic and delta troponin negative.   EKG Interpretation  Date/Time:  Saturday August 22 2015 13:11:17 EST Ventricular Rate:  78 PR Interval:  154 QRS Duration: 86 QT Interval:  390 QTC Calculation: 444 R Axis:   41 Text Interpretation:  Normal sinus rhythm Low voltage QRS Borderline ECG Confirmed by Jarrid Lienhard MD, Duwayne HeckANIELLE ([id number]) on 08/22/2015 4:21:58 PM       I performed a history and physical examination of [name] and discussed her management with Dr. Janne LabProux.  I agree with the history, physical, assessment, and plan of care, with the following exceptions: None  I was present for the following procedures: None Time Spent in Critical Care of the patient: None Time spent in discussions with the patient and family: 10  Cyleigh Massaro Hilario QuarryS    Bayleigh Loflin, MD 08/22/15 1801

## 2016-01-19 ENCOUNTER — Emergency Department (HOSPITAL_COMMUNITY): Payer: Self-pay

## 2016-01-19 ENCOUNTER — Encounter (HOSPITAL_COMMUNITY): Payer: Self-pay

## 2016-01-19 ENCOUNTER — Emergency Department (HOSPITAL_COMMUNITY)
Admission: EM | Admit: 2016-01-19 | Discharge: 2016-01-19 | Disposition: A | Discharge status: Home / Self Care | Payer: Self-pay | Attending: Emergency Medicine | Admitting: Emergency Medicine

## 2016-01-19 DIAGNOSIS — F17210 Nicotine dependence, cigarettes, uncomplicated: Secondary | ICD-10-CM | POA: Insufficient documentation

## 2016-01-19 DIAGNOSIS — X509XXA Other and unspecified overexertion or strenuous movements or postures, initial encounter: Secondary | ICD-10-CM | POA: Insufficient documentation

## 2016-01-19 DIAGNOSIS — M25561 Pain in right knee: Secondary | ICD-10-CM | POA: Insufficient documentation

## 2016-01-19 DIAGNOSIS — Y939 Activity, unspecified: Secondary | ICD-10-CM | POA: Insufficient documentation

## 2016-01-19 DIAGNOSIS — Y999 Unspecified external cause status: Secondary | ICD-10-CM | POA: Insufficient documentation

## 2016-01-19 DIAGNOSIS — Y929 Unspecified place or not applicable: Secondary | ICD-10-CM | POA: Insufficient documentation

## 2016-01-19 MED ORDER — IBUPROFEN 400 MG PO TABS
800.0000 mg | ORAL_TABLET | Freq: Once | ORAL | Status: AC
  Administered 2016-01-19: 800 mg via ORAL
  Filled 2016-01-19: qty 2

## 2016-01-19 MED ORDER — NAPROXEN 500 MG PO TABS: 500.0000 mg | ORAL_TABLET | Freq: Two times a day (BID) | ORAL | Status: DC

## 2016-01-19 MED ORDER — HYDROCODONE-ACETAMINOPHEN 5-325 MG PO TABS
1.0000 | ORAL_TABLET | Freq: Once | ORAL | Status: AC
Start: 2016-01-19 — End: 2016-01-19
  Administered 2016-01-19: 1 via ORAL
  Filled 2016-01-19: qty 1

## 2016-01-19 NOTE — ED Notes (Signed)
Pt verbalized understanding of d/c instructions and is to follow up with ortho. Pt verbalized understanding of d/c instructions and has no further questions. Pt stable and NAD.

## 2016-01-19 NOTE — ED Provider Notes (Signed)
CSN: [id number]     Arrival date & time 01/19/16  1231 History  By signing my name below, I, [name], attest that this documentation has been prepared under the direction and in the presence of non-physician practitioner, Tiffany FowlerKayla Tamre Cass, PA-C. Electronically Signed: Marisue HumbleMichelle Moody, Scribe. 01/19/2016. 1:53 PM.    Chief Complaint  Patient presents with  . Knee Injury    The history is provided by the patient. No language interpreter was used.   HPI Comments:  [name] is a 55 y.o. female with PMHx of sciatica who presents to the Emergency Department complaining of gradual onset, constant, waxing and waning right knee pain and swelling for the past 3 weeks. She reports stepping off a ladder and thinks she may have heard a pop prior to pain onset. Pain worsens when twisting the knee. Pt takes hydrocodone for back pain, which is not providing relief of knee pain; she has also taken Ibuprofen without relief. Denies h/o blood clots, use of hormone supplements, recent surgery, leg swelling, fever, or chills.   Past Medical History  Diagnosis Date  . High cholesterol    Past Surgical History  Procedure Laterality Date  . Abdominal hysterectomy    . Carpal tunnel release Right   . Melanoma removal    . Cesarean section     No family history on file. Social History  Substance Use Topics  . Smoking status: Current Every Day Smoker -- 0.50 packs/day    Types: Cigarettes  . Smokeless tobacco: None  . Alcohol Use: No   OB History    No data available     Review of Systems  Constitutional: Negative for fever and chills.  Cardiovascular: Negative for leg swelling.  Musculoskeletal: Positive for myalgias, joint swelling and arthralgias.    Allergies  Review of patient's allergies indicates no known allergies.  Home Medications   Prior to Admission medications   Medication Sig Start Date End Date Taking? Authorizing Provider  clonazePAM (KLONOPIN) 1 MG tablet Take 2 mg by  mouth at bedtime.    Yes Historical Provider, MD  ibuprofen (ADVIL,MOTRIN) 200 MG tablet Take 1,000 mg by mouth daily as needed for moderate pain.   Yes Historical Provider, MD  PARoxetine (PAXIL) 40 MG tablet Take 40 mg by mouth every morning.   Yes Historical Provider, MD  naproxen (NAPROSYN) 500 MG tablet Take 1 tablet (500 mg total) by mouth 2 (two) times daily. 01/19/16   Tiffany Kreiser, PA-C   BP 115/55 mmHg  Pulse 61  Temp(Src) 98.2 F (36.8 C) (Oral)  Resp 18  Ht 5\' 8"  (1.727 m)  Wt 100.699 kg  BMI 33.76 kg/m2  SpO2 100%   Physical Exam  Constitutional: She is oriented to person, place, and time. She appears well-developed and well-nourished.  HENT:  Head: Normocephalic and atraumatic.  Right Ear: External ear normal.  Left Ear: External ear normal.  Eyes: Conjunctivae are normal. No scleral icterus.  Neck: No tracheal deviation present.  Cardiovascular: Intact distal pulses.   Pulses:      Dorsalis pedis pulses are 2+ on the right side, and 2+ on the left side.  Pulmonary/Chest: Effort normal. No respiratory distress.  Abdominal: She exhibits no distension.  Musculoskeletal: Normal range of motion.  Right knee with diffuse tenderness.  No warmth, erythema, or swelling.  FAROM with minimal pain.  Neurological: She is alert and oriented to person, place, and time.  Strength and sensation intact bilaterally. 5/5 strength.  Normal gait.  Skin: Skin is warm and dry.  Psychiatric: She has a normal mood and affect. Her behavior is normal.    ED Course  Procedures  DIAGNOSTIC STUDIES: Oxygen Saturation is 97% on RA, normal by my interpretation.    COORDINATION OF CARE: 1:51 PM Will order x-ray of right knee and administer pain medication. Recommended follow-up with orthopedist. Discussed treatment plan with pt at bedside and pt agreed to plan.  Labs Review Labs Reviewed - No data to display  Imaging Review Dg Knee Complete 4 Views Right  01/19/2016  CLINICAL DATA:  No  posterior right knee pain and swelling for 1 month. Difficulty walking. No known injury. EXAM: RIGHT KNEE - COMPLETE 4+ VIEW COMPARISON:  Plain films right knee 05/18/2010. FINDINGS: No acute bony or joint abnormality is identified. Chondrocalcinosis is seen and has progressed since the prior examination. There is no joint effusion. Mild medial compartment joint space narrowing is noted. IMPRESSION: No acute abnormality. 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 Mild medial compartment joint space narrowing. Electronically Signed   By: [name] M.D.   On: 01/19/2016 14:33   I have personally reviewed and evaluated these images and lab results as part of my medical decision-making.   EKG Interpretation None      MDM   Final diagnoses:  Right knee pain   Patient presents with right knee pain.  No systemic symptoms.  Taking hydrocodone.  VSS, NAD.  Neurovascularly intact.  No swelling, warmth, or erythema.  Ambulatory.  Plain films show no acute abnormality.  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  No effusion.  Doubt septic/infectious arthritis.  No indication for joint aspiration.  Discharge home with Naprosyn.  Follow up orthopedics.  Discussed return precautions.  Patient agrees and acknowledges the above plan for discharge.   I personally performed the services described in this documentation, which was scribed in my presence. The recorded information has been reviewed and is accurate.    Tiffany Fowler, PA-C 01/19/16 1634  Pricilla Loveless, MD 01/22/16 [id number]

## 2016-01-19 NOTE — ED Notes (Signed)
Patient complains of right knee injury 3 weeks ago after stepping off ladder, arrived with knee sleeve to same, pain with ambualtion

## 2016-01-19 NOTE — ED Notes (Signed)
Hx of chronic right knee pain. No new injury. Has not been seen by Ortho.

## 2016-01-19 NOTE — Discharge Instructions (Signed)
Calcium Pyrophosphate Deposition   Calcium pyrophosphate deposition (CPPD), which is also called pseudogout, is a type of arthritis that causes pain, swelling, and inflammation in a joint. The joint pain can be severe and may last for days. If it is not treated, the pain may last much longer. Attacks of CPPD may come and go. This condition usually affects one joint at a time. The joints that are affected most commonly are the knees, but this condition can also affect the wrists, elbows, shoulders, or ankles.  CPPD is similar to gout. Both conditions result from the buildup of crystals in the joint. However, CPPD is caused by a type of crystal that is different than the crystals that cause gout.  CAUSES  This condition is caused by the buildup of calcium pyrophosphate dihydrate crystals in the joint. The reason why this buildup occurs is not known. The condition may be passed down from parent to child (hereditary).  RISK FACTORS  This condition is more likely to develop in people who:  · Are over 60 years old.  · Have a family history of the condition.  · Have had joint replacement surgery.  · Have had a recent injury.  · Have certain medical conditions, such as hemophilia, ochronosis, amyloidosis, or hormonal disorders.  · Have low blood magnesium levels.  SYMPTOMS  Symptoms of this condition include:  · Pain in a joint. The pain may:    Be intense and constant.    Come on quickly.    Get worse with movement.    Last from several days to a few weeks.  · Redness, swelling, and warmth at the joint.  · Stiffness of the joint.  DIAGNOSIS  To diagnose this condition, your health care provider will use a needle to remove fluid from the joint. The fluid will be examined under a microscope to check for the crystals that cause CPPD. You may also have imaging tests, such as:  · X-rays.  · Ultrasound.  TREATMENT  There is no way to remove the crystals from the joint and no way to cure this condition. However, treatment can  relieve symptoms and improve joint function. Treatment may include:  · Nonsteroidal anti-inflammatory drugs (NSAIDs) to reduce inflammation and pain.  · Medicines to help prevent attacks.  · Injections of medicine (cortisone) into the joint to reduce pain and swelling.  · Physical therapy to improve joint function.  HOME CARE INSTRUCTIONS  · Take medicines only as directed by your health care provider.  · Rest the affected joints until your symptoms start to go away.  · Keep your affected joints raised (elevated) when possible. This will help to reduce swelling.  · If directed, apply ice to the affected area:    Put ice in a plastic bag.    Place a towel between your skin and the bag.    Leave the ice on for 20 minutes, 2-3 times per day.  · If the painful joint is in your leg, use crutches as directed by your health care provider.  · When your symptoms start to go away, begin to exercise regularly or do physical therapy. Talk with your health care provider or physical therapist about what types of exercise are safe for you. Low-impact exercise may be best. This includes walking, swimming, bicycling, and water aerobics.  · Maintain a healthy weight so your joints do not need to bear more weight than necessary.  SEEK MEDICAL CARE IF:  · You have an increase   in joint pain that is not relieved with medicine.  · Your joint becomes more red, swollen, or stiff.  · You have a fever.  · You have a skin rash.     This information is not intended to replace advice given to you by your health care provider. Make sure you discuss any questions you have with your health care provider.     Document Released: 04/16/2004 Document Revised: 12/09/2014 Document Reviewed: 07/02/2014  Elsevier Interactive Patient Education ©2016 [employer]

## 2016-02-18 ENCOUNTER — Emergency Department (HOSPITAL_COMMUNITY)
Admission: EM | Admit: 2016-02-18 | Discharge: 2016-02-18 | Disposition: A | Discharge status: Home / Self Care | Payer: Self-pay | Attending: Emergency Medicine | Admitting: Emergency Medicine

## 2016-02-18 ENCOUNTER — Encounter (HOSPITAL_COMMUNITY): Payer: Self-pay | Admitting: *Deleted

## 2016-02-18 DIAGNOSIS — N390 Urinary tract infection, site not specified: Secondary | ICD-10-CM | POA: Insufficient documentation

## 2016-02-18 DIAGNOSIS — Z79899 Other long term (current) drug therapy: Secondary | ICD-10-CM | POA: Insufficient documentation

## 2016-02-18 DIAGNOSIS — F17210 Nicotine dependence, cigarettes, uncomplicated: Secondary | ICD-10-CM | POA: Insufficient documentation

## 2016-02-18 LAB — URINALYSIS, ROUTINE W REFLEX MICROSCOPIC
BILIRUBIN URINE: NEGATIVE
GLUCOSE, UA: NEGATIVE mg/dL
KETONES UR: 15 mg/dL — AB
Nitrite: NEGATIVE
PROTEIN: 100 mg/dL — AB
Specific Gravity, Urine: 1.031 — ABNORMAL HIGH (ref 1.005–1.030)
pH: 6 (ref 5.0–8.0)

## 2016-02-18 LAB — URINE MICROSCOPIC-ADD ON

## 2016-02-18 MED ORDER — FOSFOMYCIN TROMETHAMINE 3 G PO PACK
3.0000 g | PACK | Freq: Once | ORAL | Status: AC
  Administered 2016-02-18: 3 g via ORAL
  Filled 2016-02-18: qty 3

## 2016-02-18 NOTE — ED Notes (Signed)
Pt states that she has been getting treated for a UTI x 2 months. States that her PCP will not run a culture unless she pays upfront so she came to ED. States she does not have any burning with urination, painful urination or foul smelling odor.

## 2016-02-18 NOTE — Discharge Instructions (Signed)
Please follow with your primary care doctor in the next 2 days for a check-up. They must obtain records for further management.  ° °Do not hesitate to return to the Emergency Department for any new, worsening or concerning symptoms.  ° ° °Urinary Tract Infection °Urinary tract infections (UTIs) can develop anywhere along your urinary tract. Your urinary tract is your body's drainage system for removing wastes and extra water. Your urinary tract includes two kidneys, two ureters, a bladder, and a urethra. Your kidneys are a pair of bean-shaped organs. Each kidney is about the size of your fist. They are located below your ribs, one on each side of your spine. °CAUSES °Infections are caused by microbes, which are microscopic organisms, including fungi, viruses, and bacteria. These organisms are so small that they can only be seen through a microscope. Bacteria are the microbes that most commonly cause UTIs. °SYMPTOMS  °Symptoms of UTIs may vary by age and gender of the patient and by the location of the infection. Symptoms in young women typically include a frequent and intense urge to urinate and a painful, burning feeling in the bladder or urethra during urination. Older women and men are more likely to be tired, shaky, and weak and have muscle aches and abdominal pain. A fever may mean the infection is in your kidneys. Other symptoms of a kidney infection include pain in your back or sides below the ribs, nausea, and vomiting. °DIAGNOSIS °To diagnose a UTI, your caregiver will ask you about your symptoms. Your caregiver will also ask you to provide a urine sample. The urine sample will be tested for bacteria and white blood cells. White blood cells are made by your body to help fight infection. °TREATMENT  °Typically, UTIs can be treated with medication. Because most UTIs are caused by a bacterial infection, they usually can be treated with the use of antibiotics. The choice of antibiotic and length of treatment  depend on your symptoms and the type of bacteria causing your infection. °HOME CARE INSTRUCTIONS °· If you were prescribed antibiotics, take them exactly as your caregiver instructs you. Finish the medication even if you feel better after you have only taken some of the medication. °· Drink enough water and fluids to keep your urine clear or pale yellow. °· Avoid caffeine, tea, and carbonated beverages. They tend to irritate your bladder. °· Empty your bladder often. Avoid holding urine for Dains periods of time. °· Empty your bladder before and after sexual intercourse. °· After a bowel movement, women should cleanse from front to back. Use each tissue only once. °SEEK MEDICAL CARE IF:  °· You have back pain. °· You develop a fever. °· Your symptoms do not begin to resolve within 3 days. °SEEK IMMEDIATE MEDICAL CARE IF:  °· You have severe back pain or lower abdominal pain. °· You develop chills. °· You have nausea or vomiting. °· You have continued burning or discomfort with urination. °MAKE SURE YOU:  °· Understand these instructions. °· Will watch your condition. °· Will get help right away if you are not doing well or get worse. °  °This information is not intended to replace advice given to you by your health care provider. Make sure you discuss any questions you have with your health care provider. °  °Document Released: 05/04/2005 Document Revised: 04/15/2015 Document Reviewed: 09/02/2011 °Elsevier Interactive Patient Education ©2016 [employer] ° °

## 2016-02-18 NOTE — ED Notes (Signed)
Pt verbalized understanding of d/c instructions and has no further questions. Pt stable and NAD.  

## 2016-02-18 NOTE — ED Provider Notes (Signed)
CSN: [id number]     Arrival date & time 02/18/16  1511 History  By signing my name below, I, [name], attest that this documentation has been prepared under the direction and in the presence of [employer], PA-C. Electronically Signed: Angelene Giovanni, ED Scribe. 02/18/2016. 6:50 PM.    Chief Complaint  Patient presents with  . Recurrent UTI   The history is provided by the patient. No language interpreter was used.   HPI Comments: [name] is a 55 y.o. female with a hx of high cholestrol who presents to the Emergency Department complaining of ongoing foul smelling urine onset 2 months ago. She reports associated intermittent chills. Pt states that she went to see her PCP with Leukocytes and blood in her urine and given a Sulfa drug and another antibiotic over the course of two months. She adds that her PCP would not culture her urine unless she pays upfront but she has issues with her insurance. No other alleviating factors noted. Pt has not tried any other medications PTA. She denies any dysuria, hematuria, bladder spasms, fever, or back pain.    Past Medical History  Diagnosis Date  . High cholesterol    Past Surgical History  Procedure Laterality Date  . Abdominal hysterectomy    . Carpal tunnel release Right   . Melanoma removal    . Cesarean section     No family history on file. Social History  Substance Use Topics  . Smoking status: Current Every Day Smoker -- 0.50 packs/day    Types: Cigarettes  . Smokeless tobacco: None  . Alcohol Use: No   OB History    No data available     Review of Systems  A complete 10 system review of systems was obtained and all systems are negative except as noted in the HPI and PMH.    Allergies  Review of patient's allergies indicates no known allergies.  Home Medications   Prior to Admission medications   Medication Sig Start Date End Date Taking? Authorizing Provider  clonazePAM (KLONOPIN) 1 MG tablet Take 2 mg  by mouth at bedtime.     Historical Provider, MD  ibuprofen (ADVIL,MOTRIN) 200 MG tablet Take 1,000 mg by mouth daily as needed for moderate pain.    Historical Provider, MD  naproxen (NAPROSYN) 500 MG tablet Take 1 tablet (500 mg total) by mouth 2 (two) times daily. 01/19/16   Cheri Fowler, PA-C  PARoxetine (PAXIL) 40 MG tablet Take 40 mg by mouth every morning.    Historical Provider, MD   BP 135/71 mmHg  Pulse 62  Temp(Src) 98.1 F (36.7 C) (Oral)  Resp 20  Ht  (1.727 m)  Wt 218 lb (98.884 kg)  BMI 33.15 kg/m2  SpO2 98% Physical Exam  Constitutional: She is oriented to person, place, and time. She appears well-developed and well-nourished. No distress.  HENT:  Head: Normocephalic and atraumatic.  Mouth/Throat: Oropharynx is clear and moist.  Eyes: Conjunctivae and EOM are normal.  Neck: Normal range of motion.  Cardiovascular: Normal rate.   Pulmonary/Chest: Effort normal. No stridor.  Abdominal: Soft. Bowel sounds are normal. She exhibits no distension and no mass. There is no tenderness. There is no rebound and no guarding.  Genitourinary:  No CVA tenderness to percussion bilaterally  Musculoskeletal: Normal range of motion.  Neurological: She is alert and oriented to person, place, and time.  Skin: Skin is warm and dry.  Psychiatric: She has a normal mood and affect.  Nursing note and vitals reviewed.   ED Course  Procedures (including critical care time) DIAGNOSTIC STUDIES: Oxygen Saturation is 98% on RA, normal by my interpretation.    COORDINATION OF CARE: 6:47 PM- Pt advised of plan for treatment and pt agrees. Return precautions discussed with worsen symptoms, fever, or vomiting.    Labs Review Labs Reviewed  URINALYSIS, ROUTINE W REFLEX MICROSCOPIC (NOT [facility])    Wynetta EmeryNicole Torez Beauregard, PA-C has personally reviewed and evaluated these lab results as part of her medical decision-making.   MDM   Final diagnoses:  UTI (lower urinary tract infection)   Filed  Vitals:   02/18/16 1528 02/18/16 1708 02/18/16 1923  BP: 151/85 135/71 145/77  Pulse: 69 62 55  Temp: 98.2 F (36.8 C) 98.1 F (36.7 C) 98 F (36.7 C)  TempSrc: Oral Oral Oral  Resp: 16 20 18   Height: 5\' 8"  (1.727 m)    Weight: 98.884 kg    SpO2: 98% 98% 98%    Medications  fosfomycin (MONUROL) packet 3 g (3 g Oral Given 02/18/16 1940)    [name] is 55 y.o. female presenting with Recurrent UTI for 2 months, initially she was asymptomatic but is becoming more symptomatic. Unfortunately, this patient has been unable to obtain a urine culture because she cannot afford it. Patient with no systemic signs of infection, no CVA tenderness, urinalysis is consistent with infection. She has no other vaginal symptoms, think this is likely UTI, will treat her with fosfomycin and culture is pending, urology referral is given in case symptoms persist or recur.  Evaluation does not show pathology that would require ongoing emergent intervention or inpatient treatment. Pt is hemodynamically stable and mentating appropriately. Discussed findings and plan with patient/guardian, who agrees with care plan. All questions answered. Return precautions discussed and outpatient follow up given.    I personally performed the services described in this documentation, which was scribed in my presence. The recorded information has been reviewed and is accurate.   Joni Reiningicole Miracle Mongillo, PA-C 02/19/16 [id number]  Geoffery Lyonsouglas Delo, MD 02/19/16 1051

## 2016-02-20 LAB — URINE CULTURE: Culture: >=100000 — AB

## 2016-02-21 ENCOUNTER — Telehealth (HOSPITAL_BASED_OUTPATIENT_CLINIC_OR_DEPARTMENT_OTHER): Payer: Self-pay

## 2016-02-21 NOTE — Telephone Encounter (Signed)
Post ED Visit - Positive Culture Follow-up  Culture report reviewed by antimicrobial stewardship pharmacist:  []  [name], Pharm.D. []  [name], Pharm.D., BCPS []  [name], Pharm.D. []  [name], Pharm.D., BCPS []  [name], [address]Pharm[address]D., BCPS, AAHIVP []  [name], Pharm.D., BCPS, AAHIVP []  [name], Pharm.D. []  [name], [place]. [name] Pharm D Positive Urine culture with contaminant, no further patient follow-up is required at this time.  [name], [name] 02/21/2016, 11:22 AM

## 2016-03-18 ENCOUNTER — Encounter (HOSPITAL_COMMUNITY): Payer: Self-pay | Admitting: Emergency Medicine

## 2016-03-18 ENCOUNTER — Emergency Department (HOSPITAL_COMMUNITY)
Admission: EM | Admit: 2016-03-18 | Discharge: 2016-03-18 | Disposition: A | Discharge status: Home / Self Care | Payer: Self-pay | Attending: Emergency Medicine | Admitting: Emergency Medicine

## 2016-03-18 DIAGNOSIS — G5602 Carpal tunnel syndrome, left upper limb: Secondary | ICD-10-CM | POA: Insufficient documentation

## 2016-03-18 DIAGNOSIS — F17210 Nicotine dependence, cigarettes, uncomplicated: Secondary | ICD-10-CM | POA: Insufficient documentation

## 2016-03-18 NOTE — ED Notes (Signed)
Numbness and tingling to R fingers x 4 days. No neuro symptoms per patient.

## 2016-03-18 NOTE — ED Provider Notes (Signed)
[facility]-EMERGENCY DEPT Provider Note   CSN: [id number] Arrival date & time: 03/18/16  [id number]  First Provider Contact:  First MD Initiated Contact with Patient 03/18/16 0935        History   Chief Complaint Chief Complaint  Patient presents with  . Numbness    fingers  . Arm Pain    HPI Myrtie SomanLorri R Keasling is a 55 y.o. female.  The history is provided by the patient. No language interpreter was used.  Arm Pain     Myrtie SomanLorri R Shimel is a 55 y.o. female who presents to the Emergency Department complaining of numbness.  She reports 3 days of numbness to the first through third digits of the left hand. Symptoms happen upon waking and improve with ranging her hand. Today when she awoke she experienced persistent tingling in her left hand and she endorses associated pain on the left ulnar aspect of her forearm, elbow, upper arm. The pain is described as a constant, sharp pain.  There is no significant change in her pain with range of motion or activities. There is no additional numbness. No weakness. No recent injuries. She is right handed. No change in her sleep habits. She does have a remote history of melanoma. No other medical problems. She denies neck pain, fever, chest pain, shortness of breath, excessive fatigue or dyspnea on exertion.  Past Medical History:  Diagnosis Date  . High cholesterol     There are no active problems to display for this patient.   Past Surgical History:  Procedure Laterality Date  . ABDOMINAL HYSTERECTOMY    . CARPAL TUNNEL RELEASE Right   . CESAREAN SECTION    . melanoma removal      OB History    No data available       Home Medications    Prior to Admission medications   Medication Sig Start Date End Date Taking? Authorizing Provider  clonazePAM (KLONOPIN) 1 MG tablet Take 1 mg by mouth at bedtime.    Yes Historical Provider, MD  HYDROcodone-acetaminophen (NORCO/VICODIN) 5-325 MG tablet Take 1 tablet by mouth every 6 (six) hours as needed for  moderate pain.   Yes Historical Provider, MD  ibuprofen (ADVIL,MOTRIN) 200 MG tablet Take 800 mg by mouth daily as needed for headache or moderate pain.    Yes Historical Provider, MD  PARoxetine (PAXIL) 40 MG tablet Take 40 mg by mouth every morning.   Yes Historical Provider, MD  naproxen (NAPROSYN) 500 MG tablet Take 1 tablet (500 mg total) by mouth 2 (two) times daily. Patient not taking: Reported on 03/18/2016 01/19/16   Cheri FowlerKayla Rose, PA-C    Family History No family history on file.  Social History Social History  Substance Use Topics  . Smoking status: Current Every Day Smoker    Packs/day: 0.50    Types: Cigarettes  . Smokeless tobacco: Current User  . Alcohol use No     Allergies   Review of patient's allergies indicates no known allergies.   Review of Systems Review of Systems  All other systems reviewed and are negative.    Physical Exam Updated Vital Signs BP 122/76 (BP Location: Right Arm)   Pulse 63   Temp 98 F (36.7 C) (Oral)   Resp 15   Ht 5\' 9"  (1.753 m)   Wt 217 lb 5 oz (98.6 kg)   SpO2 99%   BMI 32.09 kg/m   Physical Exam  Constitutional: She is oriented to person, place, and time. She  appears well-developed and well-nourished.  HENT:  Head: Normocephalic and atraumatic.  Cardiovascular: Normal rate and regular rhythm.   No murmur heard. Pulmonary/Chest: Effort normal and breath sounds normal. No respiratory distress.  Abdominal: Soft. There is no tenderness. There is no rebound and no guarding.  Musculoskeletal: She exhibits no edema or tenderness.  Neurological: She is alert and oriented to person, place, and time.  No facial asymmetry.  5/5 strength in bilateral upper and lower extremities.  Decreased sensation to light touch throughout all digits of the hand, greatest on the third digit.    Skin: Skin is warm and dry.  Psychiatric: She has a normal mood and affect. Her behavior is normal.  Nursing note and vitals reviewed.    ED  Treatments / Results  Labs (all labs ordered are listed, but only abnormal results are displayed) Labs Reviewed - No data to display  EKG  EKG Interpretation None       Radiology No results found.  Procedures Procedures (including critical care time)  Medications Ordered in ED Medications - No data to display   Initial Impression / Assessment and Plan / ED Course  I have reviewed the triage vital signs and the nursing notes.  Pertinent labs & imaging results that were available during my care of the patient were reviewed by me and considered in my medical decision making (see chart for details).  Clinical Course    Patient here for evaluation of finger numbness in median nerve distribution.  Concern for carpal tunnel syndrome based on exam.  She has no motor weakness on exam.  Plan to place in splint for comfort with outpatient follow up and return precautions.     Final Clinical Impressions(s) / ED Diagnoses   Final diagnoses:  Carpal tunnel syndrome of left wrist    New Prescriptions Discharge Medication List as of 03/18/2016 10:46 AM       Tilden Fossa, MD 03/18/16 1651

## 2016-03-18 NOTE — ED Triage Notes (Signed)
Pt. Stated, My fingers in my left hand will go numb and then my left arm will begin to hurt.  This has been going on for 2 days.

## 2016-09-01 ENCOUNTER — Encounter (HOSPITAL_COMMUNITY): Payer: Self-pay | Admitting: *Deleted

## 2016-09-01 ENCOUNTER — Emergency Department (HOSPITAL_COMMUNITY): Payer: Self-pay

## 2016-09-01 ENCOUNTER — Emergency Department (HOSPITAL_COMMUNITY)
Admission: EM | Admit: 2016-09-01 | Discharge: 2016-09-01 | Disposition: A | Discharge status: Home / Self Care | Payer: Self-pay | Attending: Emergency Medicine | Admitting: Emergency Medicine

## 2016-09-01 DIAGNOSIS — N200 Calculus of kidney: Secondary | ICD-10-CM

## 2016-09-01 DIAGNOSIS — N132 Hydronephrosis with renal and ureteral calculous obstruction: Secondary | ICD-10-CM | POA: Insufficient documentation

## 2016-09-01 DIAGNOSIS — F17210 Nicotine dependence, cigarettes, uncomplicated: Secondary | ICD-10-CM | POA: Insufficient documentation

## 2016-09-01 LAB — URINALYSIS, ROUTINE W REFLEX MICROSCOPIC
BILIRUBIN URINE: NEGATIVE
Glucose, UA: NEGATIVE mg/dL
KETONES UR: 5 mg/dL — AB
Nitrite: NEGATIVE
PROTEIN: 100 mg/dL — AB
Specific Gravity, Urine: 1.02 (ref 1.005–1.030)
pH: 6 (ref 5.0–8.0)

## 2016-09-01 MED ORDER — OXYCODONE-ACETAMINOPHEN 5-325 MG PO TABS: 1.0000 | ORAL_TABLET | Freq: Four times a day (QID) | ORAL | 0 refills | Status: DC | PRN

## 2016-09-01 MED ORDER — IBUPROFEN 600 MG PO TABS: 600.0000 mg | ORAL_TABLET | Freq: Three times a day (TID) | ORAL | 0 refills | Status: DC

## 2016-09-01 MED ORDER — HYDROCODONE-ACETAMINOPHEN 5-325 MG PO TABS
1.0000 | ORAL_TABLET | Freq: Once | ORAL | Status: AC
  Administered 2016-09-01: 1 via ORAL
  Filled 2016-09-01: qty 1

## 2016-09-01 MED ORDER — OXYCODONE-ACETAMINOPHEN 5-325 MG PO TABS
1.0000 | ORAL_TABLET | Freq: Once | ORAL | Status: AC
  Administered 2016-09-01: 1 via ORAL
  Filled 2016-09-01: qty 1

## 2016-09-01 MED ORDER — KETOROLAC TROMETHAMINE 30 MG/ML IJ SOLN
30.0000 mg | Freq: Once | INTRAMUSCULAR | Status: AC
  Administered 2016-09-01: 30 mg via INTRAMUSCULAR
  Filled 2016-09-01: qty 1

## 2016-09-01 NOTE — ED Triage Notes (Signed)
PT is here with left mid and lower back pain and hurts with palpation.  Pt states there for 2 weeks and ibuprofen is not touching it today.  Not urinary symptoms. No fever.

## 2016-09-01 NOTE — ED Notes (Signed)
Patient transported to CT 

## 2016-09-01 NOTE — ED Notes (Signed)
Pt is in stable condition upon d/c and ambulates from ED. 

## 2016-09-01 NOTE — ED Provider Notes (Signed)
[facility] Provider Note   CSN: [id number] Arrival date & time: 09/01/16  [id number]     History   Chief Complaint Chief Complaint  Patient presents with  . Back Pain    HPI [name] is a 56 y.o. female.  HPI  Patient presents with concern of left flank pain. Onset was gradual over the past 2 days, but pain was severe today. Pain is nonradiating, worse with activity. Minimal relief with ibuprofen. She has a history of prior lumbar spine surgery, known sciatica on the right side. Previously the patient was on Norco for pain control of her right-sided sciatica. She is not currently taking this medication. She denies other focal changes including no abdominal pain, dysuria, nausea, vomiting, diarrhea, weakness in her extremities. She does have history of recent UTI.   Past Medical History:  Diagnosis Date  . High cholesterol     There are no active problems to display for this patient.   Past Surgical History:  Procedure Laterality Date  . ABDOMINAL HYSTERECTOMY    . BACK SURGERY    . CARPAL TUNNEL RELEASE Right   . CESAREAN SECTION    . melanoma removal      OB History    No data available       Home Medications    Prior to Admission medications   Medication Sig Start Date End Date Taking? Authorizing Provider  clonazePAM (KLONOPIN) 1 MG tablet Take 1 mg by mouth at bedtime.     Historical Provider, MD  HYDROcodone-acetaminophen (NORCO/VICODIN) 5-325 MG tablet Take 1 tablet by mouth every 6 (six) hours as needed for moderate pain.    Historical Provider, MD  ibuprofen (ADVIL,MOTRIN) 600 MG tablet Take 1 tablet (600 mg total) by mouth 3 (three) times daily. 09/01/16   Gerhard Munchobert Tallula Grindle, MD  naproxen (NAPROSYN) 500 MG tablet Take 1 tablet (500 mg total) by mouth 2 (two) times daily. Patient not taking: Reported on 03/18/2016 01/19/16   Cheri FowlerKayla Rose, PA-C  oxyCODONE-acetaminophen (PERCOCET/ROXICET) 5-325 MG tablet Take 1 tablet by mouth every 6 (six) hours as  needed for severe pain. 09/01/16   Gerhard Munchobert Aryan Sparks, MD  PARoxetine (PAXIL) 40 MG tablet Take 40 mg by mouth every morning.    Historical Provider, MD    Family History No family history on file.  Social History Social History  Substance Use Topics  . Smoking status: Current Every Day Smoker    Packs/day: 0.50    Types: Cigarettes  . Smokeless tobacco: Current User  . Alcohol use No     Allergies   Patient has no known allergies.   Review of Systems Review of Systems  Constitutional:       Per HPI, otherwise negative  HENT:       Per HPI, otherwise negative  Respiratory:       Per HPI, otherwise negative  Cardiovascular:       Per HPI, otherwise negative  Gastrointestinal: Negative for vomiting.  Endocrine:       Negative aside from HPI  Genitourinary:       Neg aside from HPI   Musculoskeletal:       Per HPI, otherwise negative  Skin: Negative.   Neurological: Negative for syncope.     Physical Exam Updated Vital Signs BP 140/86 (BP Location: Right Arm)   Pulse 79   Temp 98.4 F (36.9 C) (Oral)   Resp 16   Ht 5\' 9"  (1.753 m)   Wt 224 lb (101.6 kg)  SpO2 97%   BMI 33.08 kg/m   Physical Exam  Constitutional: She is oriented to person, place, and time. She appears well-developed and well-nourished. No distress.  HENT:  Head: Normocephalic and atraumatic.  Eyes: Conjunctivae and EOM are normal.  Cardiovascular: Normal rate and regular rhythm.   Pulmonary/Chest: Effort normal and breath sounds normal. No stridor. No respiratory distress.  Abdominal: She exhibits no distension.  Mild tenderness to palpation about the left flank inferior to the costal ridge, without any other abdominal tenderness, guarding, rebound.  Musculoskeletal: She exhibits no edema.  Referred pain to the left paraspinal area with hip flexion on the left side, right side unremarkable  Neurological: She is alert and oriented to person, place, and time. No cranial nerve deficit.    Skin: Skin is warm and dry.  Psychiatric: She has a normal mood and affect.  Nursing note and vitals reviewed.    ED Treatments / Results  Labs (all labs ordered are listed, but only abnormal results are displayed) Labs Reviewed  URINALYSIS, ROUTINE W REFLEX MICROSCOPIC - Abnormal; Notable for the following:       Result Value   APPearance HAZY (*)    Hgb urine dipstick SMALL (*)    Ketones, ur 5 (*)    Protein, ur 100 (*)    Leukocytes, UA SMALL (*)    Bacteria, UA FEW (*)    Squamous Epithelial / LPF 0-5 (*)    All other components within normal limits    Procedures Procedures (including critical care time)  Medications Ordered in ED Medications  HYDROcodone-acetaminophen (NORCO/VICODIN) 5-325 MG per tablet 1 tablet (1 tablet Oral Given 09/01/16 1052)  ketorolac (TORADOL) 30 MG/ML injection 30 mg (30 mg Intramuscular Given 09/01/16 1221)  oxyCODONE-acetaminophen (PERCOCET/ROXICET) 5-325 MG per tablet 1 tablet (1 tablet Oral Given 09/01/16 1529)     Initial Impression / Assessment and Plan / ED Course  I have reviewed the triage vital signs and the nursing notes.  Pertinent labs & imaging results that were available during my care of the patient were reviewed by me and considered in my medical decision making (see chart for details).  3:36 PM On repeat exam the patient is awake and alert, standing upright, in no distress. I reviewed the CT imaging with the patient, we discussed the size of the stone.  I discussed the patient's case with our urologist, to ensure close outpatient follow-up. With no evidence for infection, obstruction, distress, the patient will be started on analgesics, anti-inflammatories, follow up in the office.   Final Clinical Impressions(s) / ED Diagnoses   Final diagnoses:  Kidney stone    New Prescriptions New Prescriptions   OXYCODONE-ACETAMINOPHEN (PERCOCET/ROXICET) 5-325 MG TABLET    Take 1 tablet by mouth every 6 (six) hours as needed  for severe pain.     Gerhard Munch, MD 09/01/16 1537

## 2016-09-02 ENCOUNTER — Encounter (HOSPITAL_COMMUNITY): Payer: Self-pay | Admitting: Emergency Medicine

## 2016-09-02 ENCOUNTER — Emergency Department (HOSPITAL_COMMUNITY)
Admission: EM | Admit: 2016-09-02 | Discharge: 2016-09-02 | Disposition: A | Discharge status: Home / Self Care | Payer: Self-pay | Attending: Emergency Medicine | Admitting: Emergency Medicine

## 2016-09-02 DIAGNOSIS — N132 Hydronephrosis with renal and ureteral calculous obstruction: Secondary | ICD-10-CM | POA: Insufficient documentation

## 2016-09-02 DIAGNOSIS — N200 Calculus of kidney: Secondary | ICD-10-CM

## 2016-09-02 DIAGNOSIS — F17210 Nicotine dependence, cigarettes, uncomplicated: Secondary | ICD-10-CM | POA: Insufficient documentation

## 2016-09-02 LAB — BASIC METABOLIC PANEL
ANION GAP: 7 (ref 5–15)
BUN: 17 mg/dL (ref 6–20)
CHLORIDE: 103 mmol/L (ref 101–111)
CO2: 28 mmol/L (ref 22–32)
Calcium: 9 mg/dL (ref 8.9–10.3)
Creatinine, Ser: 0.75 mg/dL (ref 0.44–1.00)
GFR calc Af Amer: >60 mL/min (ref >60)
GFR calc non Af Amer: >60 mL/min (ref >60)
Glucose, Bld: 131 mg/dL — ABNORMAL HIGH (ref 65–99)
Potassium: 4 mmol/L (ref 3.5–5.1)
SODIUM: 138 mmol/L (ref 135–145)

## 2016-09-02 MED ORDER — KETOROLAC TROMETHAMINE 15 MG/ML IJ SOLN
15.0000 mg | Freq: Once | INTRAMUSCULAR | Status: AC
  Administered 2016-09-02: 15 mg via INTRAVENOUS
  Filled 2016-09-02: qty 1

## 2016-09-02 MED ORDER — HYDROMORPHONE HCL 2 MG/ML IJ SOLN
1.0000 mg | Freq: Once | INTRAMUSCULAR | Status: AC
  Administered 2016-09-02: 1 mg via INTRAVENOUS
  Filled 2016-09-02: qty 1

## 2016-09-02 MED ORDER — SODIUM CHLORIDE 0.9 % IV BOLUS (SEPSIS)
1000.0000 mL | Freq: Once | INTRAVENOUS | Status: AC
  Administered 2016-09-02: 1000 mL via INTRAVENOUS

## 2016-09-02 NOTE — Discharge Instructions (Signed)
Please follow up with urology as discussed for definitive management of your kidney stone. Drink plenty of fluids and take your medication as prescribed.

## 2016-09-02 NOTE — ED Provider Notes (Signed)
[facility]-EMERGENCY DEPT Provider Note   CSN: [id number] Arrival date & time: 09/02/16  4098     History   Chief Complaint Chief Complaint  Patient presents with  . Back Pain    HPI [name] is a 56 y.o. female presenting with continued left flank discomfort. She was seen yesterday for the same complaint and was found to have a 2 cm stone in her left collecting duct. She states that the pain had started about 2 months ago and she thought it was muscle spasm until yesterday she decided to get seen. She was sent home with Percocet, ibuprofen and close follow-up with urology. She said that she called the urology office and was told that the next appointment was given obese February 7. She explains that she cannot wait this [place]. She has not been taking ibuprofen around the clock and only took 1 Percocet because it made her itch. She denies any urinary symptoms, hematuria, radiating pain, nausea, vomiting, or any other symptoms. She is hoping that we can get her to see a urologist faster.  HPI  Past Medical History:  Diagnosis Date  . High cholesterol     There are no active problems to display for this patient.   Past Surgical History:  Procedure Laterality Date  . ABDOMINAL HYSTERECTOMY    . BACK SURGERY    . CARPAL TUNNEL RELEASE Right   . CESAREAN SECTION    . melanoma removal      OB History    No data available       Home Medications    Prior to Admission medications   Medication Sig Start Date End Date Taking? Authorizing Provider  ibuprofen (ADVIL,MOTRIN) 600 MG tablet Take 1 tablet (600 mg total) by mouth 3 (three) times daily. 09/01/16  Yes Gerhard Munch, MD  oxyCODONE-acetaminophen (PERCOCET/ROXICET) 5-325 MG tablet Take 1 tablet by mouth every 6 (six) hours as needed for severe pain. 09/01/16  Yes Gerhard Munch, MD  PARoxetine (PAXIL) 40 MG tablet Take 40 mg by mouth at bedtime.    Yes Historical Provider, MD    Family History History reviewed. No  pertinent family history.  Social History Social History  Substance Use Topics  . Smoking status: Current Every Day Smoker    Packs/day: 0.50    Types: Cigarettes  . Smokeless tobacco: Current User  . Alcohol use No     Allergies   Percocet [oxycodone-acetaminophen]   Review of Systems Review of Systems  Constitutional: Negative for chills and fever.  HENT: Negative for sore throat.   Respiratory: Negative for cough, choking, chest tightness, shortness of breath, wheezing and stridor.   Cardiovascular: Negative for chest pain, palpitations and leg swelling.  Gastrointestinal: Negative for abdominal distention, abdominal pain, blood in stool, diarrhea, nausea and vomiting.  Genitourinary: Positive for flank pain. Negative for decreased urine volume, dysuria and hematuria.       Positive for left flank pain  Musculoskeletal: Negative for arthralgias, back pain, gait problem, myalgias, neck pain and neck stiffness.  Skin: Negative for color change, pallor and rash.  Neurological: Negative for dizziness, seizures, syncope, weakness and light-headedness.  All other systems reviewed and are negative.    Physical Exam Updated Vital Signs BP 125/67   Pulse 60   Temp 98.3 F (36.8 C) (Oral)   Resp 18   Ht 5\' 9"  (1.753 m)   Wt 101.6 kg   SpO2 97%   BMI 33.08 kg/m   Physical Exam  Constitutional:  She appears well-developed and well-nourished. No distress.  Afebrile, nontoxic-appearing, sitting comfortably in bed no acute distress.  HENT:  Head: Normocephalic and atraumatic.  Eyes: Conjunctivae and EOM are normal.  Neck: Normal range of motion. Neck supple.  Cardiovascular: Normal rate, regular rhythm and normal heart sounds.   No murmur heard. Pulmonary/Chest: Effort normal and breath sounds normal. No respiratory distress. She has no wheezes. She has no rales. She exhibits no tenderness.  Abdominal: Soft. She exhibits no distension. There is no tenderness. There is no  guarding.  Left CVA tenderness  Musculoskeletal: She exhibits no edema.  Neurological: She is alert.  Skin: Skin is warm and dry. She is not diaphoretic. No pallor.  Psychiatric: She has a normal mood and affect.  Nursing note and vitals reviewed.    ED Treatments / Results  Labs (all labs ordered are listed, but only abnormal results are displayed) Labs Reviewed  BASIC METABOLIC PANEL - Abnormal; Notable for the following:       Result Value   Glucose, Bld 131 (*)    All other components within normal limits    EKG  EKG Interpretation None       Radiology Ct Renal Stone Study  Result Date: 09/01/2016 CLINICAL DATA:  Left flank pain x2 weeks EXAM: CT ABDOMEN AND PELVIS WITHOUT CONTRAST TECHNIQUE: Multidetector CT imaging of the abdomen and pelvis was performed following the standard protocol without IV contrast. COMPARISON:  03/04/2008 FINDINGS: Lower chest: Stable linear scarring laterally at the left lung base. No pleural or pericardial effusion. Coronary calcifications. Hepatobiliary: No focal liver abnormality is seen. No gallstones, gallbladder wall thickening, or biliary dilatation. Pancreas: Unremarkable. No pancreatic ductal dilatation or surrounding inflammatory changes. Spleen: Normal in size without focal abnormality. Adrenals/Urinary Tract: Normal adrenal glands. Bilateral nephrolithiasis, largest stone centrally in the left renal collecting system measuring 2 cm. There is mild left hydronephrosis. There are mild inflammatory/ edematous changes around the left renal collecting system. Ureters decompressed. Urinary bladder incompletely distended. Stomach/Bowel: Stomach, small bowel, and colon are nondilated. Normal appendix. Vascular/Lymphatic: Scattered aortoiliac arterial calcifications without aneurysm. No adenopathy localized. Bilateral pelvic phleboliths. Reproductive: Status post hysterectomy. No adnexal masses. Other: No ascites.  No free air. 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 Negative for fracture or other acute bone finding. IMPRESSION: 1. Bilateral nephrolithiasis including a large 2 cm central left renal collecting system stone, with mild hydronephrosis. 2. Coronary and aortoiliac arterial calcifications. Electronically Signed   By: Corlis Leak  Hassell M.D.   On: 09/01/2016 14:50    Procedures Procedures (including critical care time)  Medications Ordered in ED Medications  HYDROmorphone (DILAUDID) injection 1 mg (1 mg Intravenous Given 09/02/16 1209)  sodium chloride 0.9 % bolus 1,000 mL (0 mLs Intravenous Stopped 09/02/16 1339)  ketorolac (TORADOL) 15 MG/ML injection 15 mg (15 mg Intravenous Given 09/02/16 1339)     Initial Impression / Assessment and Plan / ED Course  I have reviewed the triage vital signs and the nursing notes.  Pertinent labs & imaging results that were available during my care of the patient were reviewed by me and considered in my medical decision making (see chart for details).    56 year old female presenting with continued left flank pain. Seen yesterday with CT renal showing a 2 cm collecting duct stone with mild hydronephrosis. She was referred to urology but could not get an appointment until February 7. She decided to come back to the emergency department because she could not wait this  [place]. She was prescribed Percocet which she states made her itch and she only took one. She did not take ibuprofen as recommended. She denies any decreasing urine or dysuria hematuria radiating pain, abdominal pain, fever, chills or other symptoms.  1 L fluid bolus Analgesia and BMP prior to Toradol Reassessed patient and she was feeling better. Comfortably lying in bed working on her phone in no acute distress. Normal creatinine- patient given toradol  Discussed with patient that definitive management requires urology follow up. We managed her symptoms while in ED but she ultimately needs to follow up  with urology.  Discharge home with symptomatic relief and close follow up with urology. Discussed strict return precautions. Patient was advised to return to the emergency department if experiencing any new or worsening symptoms. She understood instructions and agreed with discharge plan. She states that she will take the ibuprofen and wants to give percocet a second trial.  Patient was discussed with Dr. Juleen China who agrees with assessment and plan.  Final Clinical Impressions(s) / ED Diagnoses   Final diagnoses:  Nephrolithiasis    New Prescriptions New Prescriptions   No medications on file     Gregary Cromer 09/02/16 1455    Raeford Razor, MD 09/05/16 1659

## 2016-09-02 NOTE — ED Triage Notes (Signed)
Pt was diagnosed with kidney stone yesterday on the left side. Pt states she has a follow up appointment feb 7th with MD for this. Pt states she is in terrible pain and cannot wait that Barb to see the MD.

## 2016-09-20 ENCOUNTER — Other Ambulatory Visit: Payer: Self-pay | Admitting: Urology

## 2016-09-23 NOTE — Patient Instructions (Addendum)
[name]  09/23/2016   Your procedure is scheduled on: 09-27-16  [facility] 3[facility] floor to  Short Stay Center at 8AM.  Call this number if you have problems the morning of surgery [phone]   Remember: ONLY 1 PERSON MAY GO WITH YOU TO SHORT STAY TO GET  READY MORNING OF YOUR SURGERY.  Do not eat food or drink liquids :After Midnight.     Take these medicines the morning of surgery with A SIP OF WATER: hydrocodone as needed, paroxetine(Paxil), clonazepam(Klonopin)                                You may not have any metal on your body including hair pins and              piercings  Do not wear jewelry, make-up, lotions, powders or perfumes, deodorant             Do not wear nail polish.  Do not shave  48 hours prior to surgery.              Men may shave face and neck.   Do not bring valuables to the hospital. [place] IS NOT             RESPONSIBLE   FOR VALUABLES.  Contacts, dentures or bridgework may not be worn into surgery.  Leave suitcase in the car. After surgery it may be brought to your room.              Please read over the following fact sheets you were given: _____________________________________________________________________             [facility] Pre[facility] Before surgery, you can play an important role.  Because skin is not sterile, your skin needs to be as free of germs as possible.  You can reduce the number of germs on your skin by washing with CHG (chlorahexidine gluconate) soap before surgery.  CHG is an antiseptic cleaner which kills germs and bonds with the skin to continue killing germs even after washing. Please DO NOT use if you have an allergy to CHG or antibacterial soaps.  If your skin becomes reddened/irritated stop using the CHG and inform your nurse when you arrive at Short Stay. Do not shave (including legs and underarms) for at least 48 hours prior to the  first CHG shower.  You may shave your face/neck. Please follow these instructions carefully:  1.  Shower with CHG Soap the night before surgery and the  morning of Surgery.  2.  If you choose to wash your hair, wash your hair first as usual with your  normal  shampoo.  3.  After you shampoo, rinse your hair and body thoroughly to remove the  shampoo.                           4.  Use CHG as you would any other liquid soap.  You can apply chg directly  to the skin and wash                       Gently with a scrungie or clean washcloth.  5.  Apply the CHG Soap to  your body ONLY FROM THE NECK DOWN.   Do not use on face/ open                           Wound or open sores. Avoid contact with eyes, ears mouth and genitals (private parts).                       Wash face,  Genitals (private parts) with your normal soap.             6.  Wash thoroughly, paying special attention to the area where your surgery  will be performed.  7.  Thoroughly rinse your body with warm water from the neck down.  8.  DO NOT shower/wash with your normal soap after using and rinsing off  the CHG Soap.                9.  Pat yourself dry with a clean towel.            10.  Wear clean pajamas.            11.  Place clean sheets on your bed the night of your first shower and do not  sleep with pets. Day of Surgery : Do not apply any lotions/deodorants the morning of surgery.  Please wear clean clothes to the hospital/surgery center.  FAILURE TO FOLLOW THESE INSTRUCTIONS MAY RESULT IN THE CANCELLATION OF YOUR SURGERY PATIENT SIGNATURE_________________________________  NURSE SIGNATURE__________________________________  ________________________________________________________________________  WHAT IS A BLOOD TRANSFUSION? Blood Transfusion Information  A transfusion is the replacement of blood or some of its parts. Blood is made up of multiple cells which provide different functions.  Red blood cells carry oxygen and are  used for blood loss replacement.  White blood cells fight against infection.  Platelets control bleeding.  Plasma helps clot blood.  Other blood products are available for specialized needs, such as hemophilia or other clotting disorders. BEFORE THE TRANSFUSION  Who gives blood for transfusions?   Healthy volunteers who are fully evaluated to make sure their blood is safe. This is blood bank blood. Transfusion therapy is the safest it has ever been in the practice of medicine. Before blood is taken from a donor, a complete history is taken to make sure that person has no history of diseases nor engages in risky social behavior (examples are intravenous drug use or sexual activity with multiple partners). The donor's travel history is screened to minimize risk of transmitting infections, such as malaria. The donated blood is tested for signs of infectious diseases, such as HIV and hepatitis. The blood is then tested to be sure it is compatible with you in order to minimize the chance of a transfusion reaction. If you or a relative donates blood, this is often done in anticipation of surgery and is not appropriate for emergency situations. It takes many days to process the donated blood. RISKS AND COMPLICATIONS Although transfusion therapy is very safe and saves many lives, the main dangers of transfusion include:   Getting an infectious disease.  Developing a transfusion reaction. This is an allergic reaction to something in the blood you were given. Every precaution is taken to prevent this. The decision to have a blood transfusion has been considered carefully by your caregiver before blood is given. Blood is not given unless the benefits outweigh the risks. AFTER THE TRANSFUSION  Right after receiving a blood transfusion, you will usually  feel much better and more energetic. This is especially true if your red blood cells have gotten low (anemic). The transfusion raises the level of the red  blood cells which carry oxygen, and this usually causes an energy increase.  The nurse administering the transfusion will monitor you carefully for complications. HOME CARE INSTRUCTIONS  No special instructions are needed after a transfusion. You may find your energy is better. Speak with your caregiver about any limitations on activity for underlying diseases you may have. SEEK MEDICAL CARE IF:   Your condition is not improving after your transfusion.  You develop redness or irritation at the intravenous (IV) site. SEEK IMMEDIATE MEDICAL CARE IF:  Any of the following symptoms occur over the next 12 hours:  Shaking chills.  You have a temperature by mouth above 102 F (38.9 C), not controlled by medicine.  Chest, back, or muscle pain.  People around you feel you are not acting correctly or are confused.  Shortness of breath or difficulty breathing.  Dizziness and fainting.  You get a rash or develop hives.  You have a decrease in urine output.  Your urine turns a dark color or changes to pink, red, or brown. Any of the following symptoms occur over the next 10 days:  You have a temperature by mouth above 102 F (38.9 C), not controlled by medicine.  Shortness of breath.  Weakness after normal activity.  The white part of the eye turns yellow (jaundice).  You have a decrease in the amount of urine or are urinating less often.  Your urine turns a dark color or changes to pink, red, or brown. Document Released: 07/22/2000 Document Revised: 10/17/2011 Document Reviewed: 03/10/2008 [facility] Patient Information 2014 [employer], [place].  _______________________________________________________________________

## 2016-09-23 NOTE — Progress Notes (Signed)
BMP 09-02-16 epic

## 2016-09-26 ENCOUNTER — Encounter (HOSPITAL_COMMUNITY): Payer: Self-pay

## 2016-09-26 ENCOUNTER — Encounter (HOSPITAL_COMMUNITY)
Admission: RE | Admit: 2016-09-26 | Discharge: 2016-09-26 | Disposition: A | Discharge status: Other Acute Inpatient Hospital | Payer: Self-pay | Source: Ambulatory Visit | Attending: Urology | Admitting: Urology

## 2016-09-26 DIAGNOSIS — Z01812 Encounter for preprocedural laboratory examination: Secondary | ICD-10-CM | POA: Insufficient documentation

## 2016-09-26 DIAGNOSIS — Z01810 Encounter for preprocedural cardiovascular examination: Secondary | ICD-10-CM | POA: Insufficient documentation

## 2016-09-26 HISTORY — DX: Adverse effect of unspecified anesthetic, initial encounter: T41.45XA

## 2016-09-26 HISTORY — DX: Other complications of anesthesia, initial encounter: T88.59XA

## 2016-09-26 HISTORY — DX: Personal history of urinary calculi: Z87.442

## 2016-09-26 LAB — CBC
HCT: 41.9 % (ref 36.0–46.0)
HEMOGLOBIN: 14.9 g/dL (ref 12.0–15.0)
MCH: 30.3 pg (ref 26.0–34.0)
MCHC: 35.6 g/dL (ref 30.0–36.0)
MCV: 85.3 fL (ref 78.0–100.0)
Platelets: 209 10*3/uL (ref 150–400)
RBC: 4.91 MIL/uL (ref 3.87–5.11)
RDW: 12.6 % (ref 11.5–15.5)
WBC: 8.2 10*3/uL (ref 4.0–10.5)

## 2016-09-26 LAB — ABO/RH: ABO/RH(D): B POS

## 2016-09-27 ENCOUNTER — Observation Stay (HOSPITAL_COMMUNITY): Payer: Self-pay

## 2016-09-27 ENCOUNTER — Ambulatory Visit (HOSPITAL_COMMUNITY): Payer: Self-pay | Admitting: Anesthesiology

## 2016-09-27 ENCOUNTER — Encounter (HOSPITAL_COMMUNITY)
Admission: RE | Disposition: A | Discharge status: Home / Self Care | Payer: Self-pay | Source: Ambulatory Visit | Attending: Urology

## 2016-09-27 ENCOUNTER — Encounter (HOSPITAL_COMMUNITY): Payer: Self-pay | Admitting: *Deleted

## 2016-09-27 ENCOUNTER — Observation Stay (HOSPITAL_COMMUNITY)
Admission: RE | Admit: 2016-09-27 | Discharge: 2016-09-28 | Disposition: A | Discharge status: Home / Self Care | Payer: Self-pay | Source: Ambulatory Visit | Attending: Urology | Admitting: Urology

## 2016-09-27 DIAGNOSIS — Z419 Encounter for procedure for purposes other than remedying health state, unspecified: Secondary | ICD-10-CM

## 2016-09-27 DIAGNOSIS — Z79899 Other long term (current) drug therapy: Secondary | ICD-10-CM | POA: Insufficient documentation

## 2016-09-27 DIAGNOSIS — F329 Major depressive disorder, single episode, unspecified: Secondary | ICD-10-CM | POA: Insufficient documentation

## 2016-09-27 DIAGNOSIS — N200 Calculus of kidney: Secondary | ICD-10-CM

## 2016-09-27 DIAGNOSIS — Z885 Allergy status to narcotic agent status: Secondary | ICD-10-CM | POA: Insufficient documentation

## 2016-09-27 DIAGNOSIS — F17200 Nicotine dependence, unspecified, uncomplicated: Secondary | ICD-10-CM | POA: Insufficient documentation

## 2016-09-27 DIAGNOSIS — Z6833 Body mass index (BMI) 33.0-33.9, adult: Secondary | ICD-10-CM | POA: Insufficient documentation

## 2016-09-27 DIAGNOSIS — Z85820 Personal history of malignant melanoma of skin: Secondary | ICD-10-CM | POA: Insufficient documentation

## 2016-09-27 DIAGNOSIS — N132 Hydronephrosis with renal and ureteral calculous obstruction: Principal | ICD-10-CM | POA: Insufficient documentation

## 2016-09-27 DIAGNOSIS — F419 Anxiety disorder, unspecified: Secondary | ICD-10-CM | POA: Insufficient documentation

## 2016-09-27 HISTORY — PX: NEPHROLITHOTOMY: SHX5134

## 2016-09-27 LAB — TYPE AND SCREEN
ABO/RH(D): B POS
Antibody Screen: NEGATIVE

## 2016-09-27 LAB — HEMOGLOBIN AND HEMATOCRIT, BLOOD
HEMATOCRIT: 40.6 % (ref 36.0–46.0)
Hemoglobin: 14.2 g/dL (ref 12.0–15.0)

## 2016-09-27 SURGERY — NEPHROLITHOTOMY PERCUTANEOUS
Anesthesia: General | Laterality: Left

## 2016-09-27 MED ORDER — TRAMADOL HCL 50 MG PO TABS: 50.0000 mg | ORAL_TABLET | Freq: Four times a day (QID) | ORAL | Status: DC | PRN

## 2016-09-27 MED ORDER — DEXAMETHASONE SODIUM PHOSPHATE 10 MG/ML IJ SOLN
INTRAMUSCULAR | Status: DC | PRN
  Administered 2016-09-27: 10 mg via INTRAVENOUS

## 2016-09-27 MED ORDER — ROCURONIUM BROMIDE 50 MG/5ML IV SOSY
PREFILLED_SYRINGE | INTRAVENOUS | Status: AC
  Filled 2016-09-27: qty 5

## 2016-09-27 MED ORDER — BELLADONNA ALKALOIDS-OPIUM 16.2-60 MG RE SUPP
1.0000 | Freq: Three times a day (TID) | RECTAL | Status: DC | PRN
  Administered 2016-09-27: 1 via RECTAL
  Filled 2016-09-27: qty 1

## 2016-09-27 MED ORDER — PROMETHAZINE HCL 25 MG/ML IJ SOLN
6.2500 mg | INTRAMUSCULAR | Status: DC | PRN
  Administered 2016-09-27: 12.5 mg via INTRAVENOUS

## 2016-09-27 MED ORDER — MIDAZOLAM HCL 2 MG/2ML IJ SOLN
INTRAMUSCULAR | Status: AC
  Filled 2016-09-27: qty 2

## 2016-09-27 MED ORDER — SUGAMMADEX SODIUM 200 MG/2ML IV SOLN
INTRAVENOUS | Status: AC
  Filled 2016-09-27: qty 2

## 2016-09-27 MED ORDER — SUGAMMADEX SODIUM 200 MG/2ML IV SOLN
INTRAVENOUS | Status: DC | PRN
  Administered 2016-09-27: 200 mg via INTRAVENOUS

## 2016-09-27 MED ORDER — TRAMADOL HCL 50 MG PO TABS: ORAL_TABLET | ORAL | 0 refills | Status: DC

## 2016-09-27 MED ORDER — CEFAZOLIN SODIUM-DEXTROSE 2-4 GM/100ML-% IV SOLN
INTRAVENOUS | Status: AC
  Filled 2016-09-27: qty 100

## 2016-09-27 MED ORDER — LACTATED RINGERS IV SOLN
INTRAVENOUS | Status: DC | PRN
  Administered 2016-09-27 (×2): via INTRAVENOUS

## 2016-09-27 MED ORDER — IOHEXOL 300 MG/ML  SOLN
INTRAMUSCULAR | Status: DC | PRN
  Administered 2016-09-27: 35 mL

## 2016-09-27 MED ORDER — TRAMADOL HCL 50 MG PO TABS
100.0000 mg | ORAL_TABLET | Freq: Four times a day (QID) | ORAL | Status: DC | PRN
  Administered 2016-09-27: 100 mg via ORAL
  Filled 2016-09-27: qty 2

## 2016-09-27 MED ORDER — SODIUM CHLORIDE 0.9 % IJ SOLN
INTRAMUSCULAR | Status: AC
  Filled 2016-09-27: qty 10

## 2016-09-27 MED ORDER — PROPOFOL 10 MG/ML IV BOLUS
INTRAVENOUS | Status: DC | PRN
  Administered 2016-09-27: 200 mg via INTRAVENOUS

## 2016-09-27 MED ORDER — CLONAZEPAM 1 MG PO TABS
1.0000 mg | ORAL_TABLET | Freq: Every day | ORAL | Status: DC
  Filled 2016-09-27: qty 1

## 2016-09-27 MED ORDER — ACETAMINOPHEN 10 MG/ML IV SOLN
INTRAVENOUS | Status: AC
  Administered 2016-09-27: 1000 mg via INTRAVENOUS
  Filled 2016-09-27: qty 100

## 2016-09-27 MED ORDER — DOCUSATE SODIUM 100 MG PO CAPS: 100.0000 mg | ORAL_CAPSULE | Freq: Two times a day (BID) | ORAL | Status: DC

## 2016-09-27 MED ORDER — PROMETHAZINE HCL 25 MG/ML IJ SOLN
12.5000 mg | Freq: Once | INTRAMUSCULAR | Status: AC
  Administered 2016-09-27: 12.5 mg via INTRAVENOUS
  Filled 2016-09-27: qty 1

## 2016-09-27 MED ORDER — ACETAMINOPHEN 10 MG/ML IV SOLN
1000.0000 mg | Freq: Four times a day (QID) | INTRAVENOUS | Status: DC
  Administered 2016-09-27 – 2016-09-28 (×3): 1000 mg via INTRAVENOUS
  Filled 2016-09-27 (×3): qty 100

## 2016-09-27 MED ORDER — PROMETHAZINE HCL 25 MG/ML IJ SOLN
INTRAMUSCULAR | Status: AC
  Filled 2016-09-27: qty 1

## 2016-09-27 MED ORDER — ONDANSETRON HCL 4 MG/2ML IJ SOLN
INTRAMUSCULAR | Status: AC
  Filled 2016-09-27: qty 2

## 2016-09-27 MED ORDER — 0.9 % SODIUM CHLORIDE (POUR BTL) OPTIME
TOPICAL | Status: DC | PRN
  Administered 2016-09-27: 1000 mL

## 2016-09-27 MED ORDER — SUCCINYLCHOLINE CHLORIDE 200 MG/10ML IV SOSY
PREFILLED_SYRINGE | INTRAVENOUS | Status: DC | PRN
  Administered 2016-09-27: 140 mg via INTRAVENOUS

## 2016-09-27 MED ORDER — LIDOCAINE-EPINEPHRINE 0.5 %-1:200000 IJ SOLN
INTRAMUSCULAR | Status: DC | PRN
  Administered 2016-09-27: 50 mL

## 2016-09-27 MED ORDER — SODIUM CHLORIDE 0.9 % IR SOLN
Status: DC | PRN
  Administered 2016-09-27: 12000 mL

## 2016-09-27 MED ORDER — LIDOCAINE 2% (20 MG/ML) 5 ML SYRINGE
INTRAMUSCULAR | Status: DC | PRN
  Administered 2016-09-27: 100 mg via INTRAVENOUS

## 2016-09-27 MED ORDER — LIDOCAINE-EPINEPHRINE 0.5 %-1:200000 IJ SOLN
INTRAMUSCULAR | Status: AC
  Filled 2016-09-27: qty 1

## 2016-09-27 MED ORDER — PAROXETINE HCL 20 MG PO TABS: 40.0000 mg | ORAL_TABLET | Freq: Every day | ORAL | Status: DC

## 2016-09-27 MED ORDER — SUFENTANIL CITRATE 50 MCG/ML IV SOLN
INTRAVENOUS | Status: DC | PRN
  Administered 2016-09-27 (×5): 5 ug via INTRAVENOUS

## 2016-09-27 MED ORDER — ONDANSETRON HCL 4 MG/2ML IJ SOLN
4.0000 mg | INTRAMUSCULAR | Status: DC | PRN
  Administered 2016-09-27: 4 mg via INTRAVENOUS
  Filled 2016-09-27: qty 2

## 2016-09-27 MED ORDER — LACTATED RINGERS IV SOLN: INTRAVENOUS | Status: DC

## 2016-09-27 MED ORDER — SUCCINYLCHOLINE CHLORIDE 200 MG/10ML IV SOSY
PREFILLED_SYRINGE | INTRAVENOUS | Status: AC
  Filled 2016-09-27: qty 10

## 2016-09-27 MED ORDER — CEFAZOLIN SODIUM-DEXTROSE 2-4 GM/100ML-% IV SOLN
2.0000 g | INTRAVENOUS | Status: AC
  Administered 2016-09-27: 2 g via INTRAVENOUS
  Filled 2016-09-27: qty 100

## 2016-09-27 MED ORDER — MIDAZOLAM HCL 5 MG/5ML IJ SOLN
INTRAMUSCULAR | Status: DC | PRN
  Administered 2016-09-27: 2 mg via INTRAVENOUS

## 2016-09-27 MED ORDER — ONDANSETRON HCL 4 MG/2ML IJ SOLN
INTRAMUSCULAR | Status: DC | PRN
  Administered 2016-09-27: 4 mg via INTRAVENOUS

## 2016-09-27 MED ORDER — HYDROMORPHONE HCL 1 MG/ML IJ SOLN
INTRAMUSCULAR | Status: AC
  Filled 2016-09-27: qty 1

## 2016-09-27 MED ORDER — ROCURONIUM BROMIDE 50 MG/5ML IV SOSY
PREFILLED_SYRINGE | INTRAVENOUS | Status: DC | PRN
  Administered 2016-09-27: 50 mg via INTRAVENOUS

## 2016-09-27 MED ORDER — KCL IN DEXTROSE-NACL 20-5-0.45 MEQ/L-%-% IV SOLN
INTRAVENOUS | Status: DC
  Administered 2016-09-27 – 2016-09-28 (×2): via INTRAVENOUS
  Filled 2016-09-27 (×2): qty 1000

## 2016-09-27 MED ORDER — HYDROMORPHONE HCL 1 MG/ML IJ SOLN
0.2500 mg | INTRAMUSCULAR | Status: DC | PRN
  Administered 2016-09-27 (×4): 0.5 mg via INTRAVENOUS

## 2016-09-27 MED ORDER — PROPOFOL 10 MG/ML IV BOLUS
INTRAVENOUS | Status: AC
  Filled 2016-09-27: qty 20

## 2016-09-27 MED ORDER — MORPHINE SULFATE (PF) 4 MG/ML IV SOLN: 2.0000 mg | INTRAVENOUS | Status: DC | PRN

## 2016-09-27 MED ORDER — LIDOCAINE 2% (20 MG/ML) 5 ML SYRINGE
INTRAMUSCULAR | Status: AC
  Filled 2016-09-27: qty 5

## 2016-09-27 MED ORDER — SUFENTANIL CITRATE 50 MCG/ML IV SOLN
INTRAVENOUS | Status: AC
Start: 2016-09-27 — End: 2016-09-27
  Filled 2016-09-27: qty 1

## 2016-09-27 MED ORDER — HYDROMORPHONE HCL 1 MG/ML IJ SOLN
INTRAMUSCULAR | Status: AC
  Administered 2016-09-27: 0.5 mg via INTRAVENOUS
  Filled 2016-09-27: qty 1

## 2016-09-27 SURGICAL SUPPLY — 53 items
BAG URINE DRAINAGE (UROLOGICAL SUPPLIES) ×6 IMPLANT
BASKET STNLS GEMINI 4WIRE 3FR (BASKET) IMPLANT
BASKET STONE NCOMPASS (UROLOGICAL SUPPLIES) IMPLANT
BASKET ZERO TIP NITINOL 2.4FR (BASKET) IMPLANT
BENZOIN TINCTURE PRP APPL 2/3 (GAUZE/BANDAGES/DRESSINGS) ×6 IMPLANT
BLADE SURG 15 STRL LF DISP TIS (BLADE) ×1 IMPLANT
BLADE SURG 15 STRL SS (BLADE) ×2
CATH AINSWORTH 30CC 24FR (CATHETERS) ×3 IMPLANT
CATH FOLEY 2WAY SLVR  5CC 16FR (CATHETERS) ×2
CATH FOLEY 2WAY SLVR 5CC 16FR (CATHETERS) ×1 IMPLANT
CATH IMAGER II 65CM (CATHETERS) ×3 IMPLANT
CATH URET 5FR 28IN OPEN ENDED (CATHETERS) ×3 IMPLANT
CATH X-FORCE N30 NEPHROSTOMY (TUBING) ×3 IMPLANT
CHLORAPREP W/TINT 26ML (MISCELLANEOUS) ×3 IMPLANT
COVER SURGICAL LIGHT HANDLE (MISCELLANEOUS) ×3 IMPLANT
DRAPE C-ARM 42X120 X-RAY (DRAPES) ×3 IMPLANT
DRAPE LINGEMAN PERC (DRAPES) ×3 IMPLANT
DRSG PAD ABDOMINAL 8X10 ST (GAUZE/BANDAGES/DRESSINGS) ×6 IMPLANT
DRSG TEGADERM 4X4.75 (GAUZE/BANDAGES/DRESSINGS) ×3 IMPLANT
DRSG TEGADERM 8X12 (GAUZE/BANDAGES/DRESSINGS) ×6 IMPLANT
FIBER LASER FLEXIVA 1000 (UROLOGICAL SUPPLIES) IMPLANT
FIBER LASER FLEXIVA 365 (UROLOGICAL SUPPLIES) IMPLANT
FIBER LASER FLEXIVA 550 (UROLOGICAL SUPPLIES) IMPLANT
FIBER LASER TRAC TIP (UROLOGICAL SUPPLIES) IMPLANT
GAUZE SPONGE 4X4 12PLY STRL (GAUZE/BANDAGES/DRESSINGS) ×3 IMPLANT
GLOVE BIOGEL M STRL SZ7.5 (GLOVE) ×3 IMPLANT
GOWN STRL REUS W/TWL XL LVL3 (GOWN DISPOSABLE) ×3 IMPLANT
GUIDEWIRE AMPLAZ .035X145 (WIRE) ×6 IMPLANT
GUIDEWIRE ANG ZIPWIRE 038X150 (WIRE) IMPLANT
GUIDEWIRE STR DUAL SENSOR (WIRE) ×3 IMPLANT
HOLDER NEEDLE AMPLATZ W/INSERT (MISCELLANEOUS) ×3 IMPLANT
IV SET EXTENSION CATH 6 NF (IV SETS) ×3 IMPLANT
KIT BASIN OR (CUSTOM PROCEDURE TRAY) ×3 IMPLANT
MANIFOLD NEPTUNE II (INSTRUMENTS) ×3 IMPLANT
NEEDLE SPNL 18GX3.5 QUINCKE PK (NEEDLE) ×3 IMPLANT
NEEDLE TROCAR 18X15 ECHO (NEEDLE) IMPLANT
NEEDLE TROCAR 18X20 (NEEDLE) ×3 IMPLANT
NS IRRIG 1000ML POUR BTL (IV SOLUTION) ×3 IMPLANT
PACK CYSTO (CUSTOM PROCEDURE TRAY) ×3 IMPLANT
PROBE LITHOCLAST ULTRA 3.8X403 (UROLOGICAL SUPPLIES) ×3 IMPLANT
PROBE PNEUMATIC 1.0MMX570MM (UROLOGICAL SUPPLIES) IMPLANT
SHEATH PEELAWAY SET 9 (SHEATH) ×3 IMPLANT
SPONGE LAP 4X18 X RAY DECT (DISPOSABLE) ×3 IMPLANT
STENT URET 6FRX26 CONTOUR (STENTS) ×3 IMPLANT
STONE CATCHER W/TUBE ADAPTER (UROLOGICAL SUPPLIES) ×3 IMPLANT
SUT ETHILON 2 0 PS N (SUTURE) ×3 IMPLANT
SYR 10ML LL (SYRINGE) ×3 IMPLANT
SYR 20CC LL (SYRINGE) ×6 IMPLANT
SYR 50ML LL SCALE MARK (SYRINGE) ×3 IMPLANT
TOWEL OR 17X26 10 PK STRL BLUE (TOWEL DISPOSABLE) ×3 IMPLANT
TUBING CONNECTING 10 (TUBING) ×4 IMPLANT
TUBING CONNECTING 10' (TUBING) ×2
WATER STERILE IRR 500ML POUR (IV SOLUTION) ×3 IMPLANT

## 2016-09-27 NOTE — Op Note (Signed)
Pre-operative diagnosis: left renal pelvis stone Post-operative diagnosis: as above   Procedure performed: cystoscopy, left retrograde pyelogram with interpretation, left percutaneous renal access, left nephrolithotomy, left nephrostogram,  Left ureteral stent placement    Surgeon: Dr. Ardis Hughs  Assistant: Dr. Virginia Crews  Anesthesia: General  Complications: None  Specimens: stone for analysis  Findings: 1. Large left renal pelvis stone fragmented and removed. Small amount of debris in lower pole removed. Successful placement of left ureteral stent  Radiographic interpretation of left retrograde pyelogram: Mild left hydronephrosis with filling defect in pelvis consistent with known stone.  EBL: Approximately 100 cc   Indication: [name] is a 56 y.o. patient with a history of nephrolithiasis with a large stone burden in the left kidney. After reviewing the management options for treatment, he elected to proceed with the above surgical procedure(s). We have discussed the potential benefits and risks of the procedure, side effects of the proposed treatment, the likelihood of the patient achieving the goals of the procedure, and any potential problems that might occur during the procedure or recuperation. Informed consent has been obtained.   Description:  Consent was obtained in the preoperative holding area. The patient was marked appropriately and then taken back to the operating room where he was intubated on the gurney. The patient was flipped prone onto the split leg OR table. Large jelly rolls were placed in the anterior axillary line on both sides allowing the patient's chest and abdomen to fall inbetween. The patient was then prepped and draped in the routine sterile fashion in the left flank and genital area. A timeout was then held confirming the proper side and procedure as well as antibiotics were administered.  I then used the flexible cystoscope and passed  gently into the patient's urethra under visual guidance. Once into the bladder I cannulated the patient's left ureteral orifice with a wire which I passed up into the left collecting system. I then advanced a 5 [place] open-ended ureteral Pollock catheter over the wire up to the UPJ. The wire was then removed and a retrograde pyelogram was performed with the above findings. A 16Fr catheter was then placed in the bladder. I then turned my attention to the patient's left flank and obtaining percutaneous renal access.  Using the C-arm rotated at 25 and the bulls-eye technique with an 18-gauge coaxial needle the lower posterior lateral calyx was targeted. Then rotating the C-arm AP depth of our needle was noted to be within the calyx and the inner part of the coaxial needle was removed. Urine was noted to return. A 0.038 sensor wire was then passed through the sheath of the coaxial needle and into the left renal collecting system. The wire was then passed down the ureter and into the bladder using fluoroscopic guide and the sheath of the needle was removed.  An angiographic catheter was then advanced into the bladder and the wire removed.  A Super Stiff wire was then passed into the angiographic catheter and the angiographic catheter removed. A 9 French peel-away dilator was then advanced over the Super Stiff wire and passed into the renal pelvis and across the UPJ under fluoroscopic guidance.  The inner part of this dilator was removed and the 0.38 sensor wire was passed alongside the Super Stiff wire through the dilator and into the left ureter and down into the bladder. The angiographic catheter again was passed over the guidewire and advanced into the bladder, the wire was then removed. A Super  Stiff wire was then passed through angiographic catheter and angiographic catheter removed. The outer part of the sheath was then removed, establishing 2 superstiff wires through the targeted calyx and into the bladder.    The 24 French NephroMax balloon was then passed over one of the Super Stiff wires and the tip guided down into the targeted calyx. The balloon was then inflated to approximately 14 atm, and once there was no waist noted under fluoroscopy the access sheath was advanced over the balloon. The balloon was then removed. The wires were then placed back into the sheaths and snapped to the drape.   Using the rigid nephroscope to explore the targeted calyx and kidney.  The stone was encountered in the renal pelvis and we used the lithotrite to fragment and remove all visible stone fragments. Then using a flexible cystoscope to navigate the remaining calyces of the kidney with some very small stone fragments encountered in the lower pole. These were then removed with suction using the rigid nephroscope.  A 0.038 sensor wire was then passed down the ureter and left in the bladder and the scope backed out over wire. The sensor wire was then backloaded over the rigid nephroscope using the stent pusher and a 26 cm x 6 French double-J ureteral stent was passed antegrade over the sensor wire down into the bladder under fluoroscopic guidance. Once the stent was in the bladder the wire was gently pulled back and a nice curl noted in the bladder. The wire completely removed from the stent, and nice curl on the proximal end of the stent was noted in the renal pelvis. The sheath was then backed out slowly to ensure that all calyces had been inspected and there was nothing behind the sheath.   A 11F ainsworth tip catheter was then passed over one of the Super Stiff wires through the sheath and into the renal pelvis. The sheath was then backed out of the kidney and cut over the red rubber catheter. A nephrostogram was then performed confirming the position of our nephrostomy tube and reassuring that there were no longer any filling defects from the patient's symptoms.  After several minutes of direct pressure and observation was  noted that there was no significant bleeding from the nephrostomy tube or around the nephrostomy tube tract. As such, I remove the nephrostomy tube as well as the safety wire. 50 cc of local anesthesia was then injected into the patient's wound, and the wound was closed with 3-0 nylon in 2 vertical mattress sutures. The incision was then padded using a bundle of 4 x 4's and Hypafix tape. Patient was subsequently rolled over to the supine position and extubated. The patient was returned to the PACU in excellent condition. At the end of the case all lap and needle and sponges were accounted for. There are no perioperative complications.

## 2016-09-27 NOTE — Anesthesia Postprocedure Evaluation (Addendum)
Anesthesia Post Note  Patient: Scherrie R Fishburn  Procedure(s) Performed: Procedure(s) (LRB): NEPHROLITHOTOMY PERCUTANEOUS WITH SURGEON ACCESS (Left)  Patient location during evaluation: PACU Anesthesia Type: General Level of consciousness: awake and alert, oriented and patient cooperative Pain management: pain level controlled Vital Signs Assessment: post-procedure vital signs reviewed and stable Respiratory status: spontaneous breathing, nonlabored ventilation, respiratory function stable and patient connected to nasal cannula oxygen Cardiovascular status: blood pressure returned to baseline and stable Postop Assessment: no signs of nausea or vomiting Anesthetic complications: no       Last Vitals:  Vitals:   09/27/16 1430 09/27/16 1445  BP: (!) 154/75 139/83  Pulse: 80 91  Resp: 14 15  Temp:      Last Pain:  Vitals:   09/27/16 1445  TempSrc:   PainSc: 8                  Katlynne Mckercher,E. Vanessa Kampf

## 2016-09-27 NOTE — H&P (Signed)
I have kidney stones.  HPI: [name] is a 56 year-old female patient who was referred by Dr. Luvenia Redden, MD who is here for renal calculi.  The problem is on the left side. This is her first kidney stone. She is currently having flank pain. She denies having back pain, groin pain, nausea, vomiting, fever, and chills. She has not caught a stone in her urine strainer since her symptoms began.   She has never had surgical treatment for calculi in the past.   56 year old female who presents for left renal pelvis stone. She was seen in the ED on 09/01/16 and diagnosed with a 2cm left renal pelvis stone on CT imaging. No other stones were seen. She reports left flank pain for the past 4 months but this was initially thought to be due to muscle spasms until her pain acutely worsened that day and she was diagnosed with the kidney stone.   She is using a large amount of ibuprofen for pain control due to itching from the previously prescribed oxycodone. Denies fevers, chills, nausea, vomiting, dysuria, hematuria. No prior history of kidney stones or stone surgery. No significant history of UTIs. She does not take any blood thinners. No prior cardiac or pulmonary history.     ALLERGIES: oxycodone    MEDICATIONS: Paroxetine Hcl 40 mg tablet     GU PSH: Hysterectomy      PSH Notes: removal of melanoma     NON-GU PSH: Back Surgery (Unspecified) Bilateral Tubal Ligation Carpal Tunnel Surgery.Marland Kitchen C-Section    GU PMH: None   NON-GU PMH: Anxiety Arthritis Depression Gout Hypercholesterolemia Skin Cancer, History    FAMILY HISTORY: Cancer - Father, Mother   SOCIAL HISTORY: Marital Status: Single Current Smoking Status: Patient smokes.   Tobacco Use Assessment Completed: Used Tobacco in last 30 days? Drinks 1 drink per week.  Drinks 1 caffeinated drink per day. 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     Notes: 2 daughters    REVIEW OF SYSTEMS:    GU Review Female:   Patient reports get  up at night to urinate. Patient denies frequent urination, hard to postpone urination, burning /pain with urination, leakage of urine, stream starts and stops, trouble starting your stream, have to strain to urinate, and currently pregnant.  Gastrointestinal (Upper):   Patient denies nausea, vomiting, and indigestion/ heartburn.  Gastrointestinal (Lower):   Patient denies diarrhea and constipation.  Constitutional:   Patient reports night sweats. Patient denies fever, weight loss, and fatigue.  Skin:   Patient denies skin rash/ lesion and itching.  Eyes:   Patient denies blurred vision and double vision.  Ears/ Nose/ Throat:   Patient denies sore throat and sinus problems.  Hematologic/Lymphatic:   Patient denies swollen glands and easy bruising.  Cardiovascular:   Patient denies leg swelling and chest pains.  Respiratory:   Patient denies cough and shortness of breath.  Endocrine:   Patient denies excessive thirst.  Musculoskeletal:   Patient reports back pain and joint pain.   Neurological:   Patient denies headaches and dizziness.  Psychologic:   Patient reports depression and anxiety.    VITAL SIGNS:      09/15/2016 03:42 PM  Weight 224 lb / 101.6 kg  Height 69 in / 175.26 cm  BP 129/84 mmHg  Pulse 69 /min  Temperature 98.4 F / 37 C  BMI 33.1 kg/m   MULTI-SYSTEM PHYSICAL EXAMINATION:    Constitutional: Well-nourished. No physical deformities. Normally developed. Good grooming.  Neck: Neck symmetrical, not swollen. Normal tracheal position.  Respiratory: No labored breathing, no use of accessory muscles.   Cardiovascular: Normal temperature, normal extremity pulses, no swelling, no varicosities.  Skin: No paleness, no jaundice, no cyanosis. No lesion, no ulcer, no rash.  Neurologic / Psychiatric: Oriented to time, oriented to place, oriented to person. No depression, no anxiety, no agitation.  Gastrointestinal: No mass, no tenderness, no rigidity, non obese abdomen. Mild left CVA  tenderness  Eyes: Normal conjunctivae. Normal eyelids.     PAST DATA REVIEWED:  Source Of History:  Patient  Records Review:   Previous Patient Records  Urine Test Review:   Urinalysis  X-Ray Review: C.T. Abdomen/Pelvis: Reviewed Films. Reviewed Report. Discussed With Patient. 2cm left renal pelvis stone with minimal hydro. No other renal or ureteral stones seen.    PROCEDURES:          Urinalysis w/Scope - 81001 Dipstick Dipstick Cont'd Micro  Color: Yellow Bilirubin: Neg WBC/hpf: 0 - 5/hpf  Appearance: Cloudy Ketones: Neg RBC/hpf: 20 - 40/hpf  Specific Gravity: 1.020 Blood: 3+ Bacteria: Rare (0-9/hpf)  pH: 6.0 Protein: Neg Cystals: NS (Not Seen)  Glucose: Neg Urobilinogen: 0.2 Casts: NS (Not Seen)    Nitrites: Neg Trichomonas: Not Present    Leukocyte Esterase: Trace Mucous: Not Present      Epithelial Cells: 0 - 5/hpf      Yeast: NS (Not Seen)      Sperm: Not Present    Notes:      ASSESSMENT:      ICD-10 Details  1 GU:   Kidney Stone - N20.0   2   Flank Pain - R10.84    PLAN:           Orders Labs Urine Culture and Sensitivity          Schedule [facility]: [facility] - Percut Stone Removal >2cm - [id number], left          Document Letter(s):  Created for Patient: Clinical Summary         Notes:   1) Left renal pelvis stone - Her stone measures 2cm on CT imaging. We discussed options such as surveillance, shock wave lithotripsy, percutaneous nephrolithotomy, staged ureteroscopy. Given that she is having pain we did not recommend surveillance. ESWL was not recommended given the stone size and likely poor stone free rate.   Risks and benefits of PCNL and ureteroscopy were discussed thoroughly and we recommended PCNL. She agreed with this due to desire to minimize procedures and be stone free. We talked about the possible need for nephrostomy tube and requirement for overnight stay. We will schedule her for next available date with  Dr. Marlou PorchHerrick. Will send urine for culture and call in antibiotics if needed.

## 2016-09-27 NOTE — Anesthesia Preprocedure Evaluation (Addendum)
Anesthesia Evaluation  Patient identified by MRN, date of birth, ID band Patient awake    Reviewed: Allergy & Precautions, NPO status , Patient's Chart, lab work & pertinent test results  Airway Mallampati: II  TM Distance: <3 FB Neck ROM: Full  Mouth opening: Limited Mouth Opening  Dental  (+) Teeth Intact   Pulmonary Current Smoker,    breath sounds clear to auscultation       Cardiovascular negative cardio ROS   Rhythm:Regular Rate:Normal     Neuro/Psych negative neurological ROS     GI/Hepatic negative GI ROS, Neg liver ROS,   Endo/Other  Morbid obesity  Renal/GU stones     Musculoskeletal   Abdominal   Peds  Hematology negative hematology ROS (+)   Anesthesia Other Findings   Reproductive/Obstetrics                            Anesthesia Physical Anesthesia Plan  ASA: II  Anesthesia Plan: General   Post-op Pain Management:    Induction: Intravenous  Airway Management Planned: Oral ETT  Additional Equipment:   Intra-op Plan:   Post-operative Plan: Extubation in OR  Informed Consent: I have reviewed the patients History and Physical, chart, labs and discussed the procedure including the risks, benefits and alternatives for the proposed anesthesia with the patient or authorized representative who has indicated his/her understanding and acceptance.     Plan Discussed with: CRNA  Anesthesia Plan Comments:         Anesthesia Quick Evaluation

## 2016-09-27 NOTE — Interval H&P Note (Signed)
History and Physical Interval Note:  09/27/2016 9:33 AM  [name]  has presented today for surgery, with the diagnosis of LEFT RENAL STONE  The various methods of treatment have been discussed with the patient and family. After consideration of risks, benefits and other options for treatment, the patient has consented to  Procedure(s): NEPHROLITHOTOMY PERCUTANEOUS WITH SURGEON ACCESS (Left) HOLMIUM LASER APPLICATION (Left) as a surgical intervention .  The patient's history has been reviewed, patient examined, no change in status, stable for surgery.  I have reviewed the patient's chart and labs.  Questions were answered to the patient's satisfaction.     Kendell Baneroy A AshlandSukhu

## 2016-09-27 NOTE — Transfer of Care (Signed)
Immediate Anesthesia Transfer of Care Note  Patient: [name]  Procedure(s) Performed: Procedure(s): NEPHROLITHOTOMY PERCUTANEOUS WITH SURGEON ACCESS (Left)  Patient Location: PACU  Anesthesia Type:General  Level of Consciousness:  sedated, patient cooperative and responds to stimulation  Airway & Oxygen Therapy:Patient Spontanous Breathing and Patient connected to face mask oxgen  Post-op Assessment:  Report given to PACU RN and Post -op Vital signs reviewed and stable  Post vital signs:  Reviewed and stable  Last Vitals:  Vitals:   09/27/16 0746  BP: 140/69  Pulse: 62  Resp: 16  Temp: 36.9 C    Complications: No apparent anesthesia complications

## 2016-09-27 NOTE — Anesthesia Procedure Notes (Signed)
Procedure Name: Intubation Date/Time: 09/27/2016 11:09 AM Performed by: Montel Clock Pre-anesthesia Checklist: Patient identified, Emergency Drugs available, Suction available, Patient being monitored and Timeout performed Patient Re-evaluated:Patient Re-evaluated prior to inductionOxygen Delivery Method: Circle system utilized Preoxygenation: Pre-oxygenation with 100% oxygen Intubation Type: IV induction Ventilation: Mask ventilation without difficulty Laryngoscope Size: Mac and 3 Grade View: Grade II Tube type: Oral Tube size: 7.5 mm Number of attempts: 1 Airway Equipment and Method: Stylet Placement Confirmation: ETT inserted through vocal cords under direct vision,  positive ETCO2 and breath sounds checked- equal and bilateral Secured at: 21 cm Tube secured with: Tape Dental Injury: Teeth and Oropharynx as per pre-operative assessment  Comments: Arytenoids visible with downward laryngeal pressure, ETT easily passed through cords.

## 2016-09-27 NOTE — Discharge Summary (Signed)
Date of admission: 09/27/2016  Date of discharge: 09/28/2016  Admission diagnosis: left renal pelvis stone  Discharge diagnosis: left renal pelvis stone  Secondary diagnoses: none  History and Physical: For full details, please see admission history and physical. Briefly, [name] is a 56 y.o. year old patient with 2cm left renal pelvis stone.   Hospital Course: [name] is s/p left PCNL with Dr. Marlou PorchHerrick on 09/27/16. She did well post-operatively and did not need a nephrostomy tube. Her foley catheter was removed on POD#1 and she passed her TOV.Her diet was slowly advanced and at the time of discharge she was tolerating a regular diet, ambulating at her baseline, was voiding spontaneously after foley catheter removal, and pain was well controlled with oral narcotics. She was discharged to home on POD#1.  Laboratory values:   Recent Labs  09/26/16 0906 09/27/16 1409  HGB 14.9 14.2  HCT 41.9 40.6   No results for input(s): CREATININE in the last 72 hours.  Disposition: Home  Discharge instruction: The patient was instructed to be ambulatory but told to refrain from heavy lifting, strenuous activity, or driving.  Discharge medications:  Allergies as of 09/28/2016      Reactions   Percocet [oxycodone-acetaminophen] Itching      Medication List    STOP taking these medications   HYDROcodone-acetaminophen 5-325 MG tablet Commonly known as:  NORCO/VICODIN   ibuprofen 600 MG tablet Commonly known as:  ADVIL,MOTRIN     TAKE these medications   clonazePAM 1 MG tablet Commonly known as:  KLONOPIN Take 1 mg by mouth daily.   PARoxetine 40 MG tablet Commonly known as:  PAXIL Take 40 mg by mouth daily.   traMADol 50 MG tablet Commonly known as:  ULTRAM Take 1-2 tablets every 6 hours as needed for pain       Followup: She will follow up in clinic for post-op check, suture removal and stent removal.

## 2016-09-28 LAB — BASIC METABOLIC PANEL
ANION GAP: 7 (ref 5–15)
BUN: 17 mg/dL (ref 6–20)
CO2: 29 mmol/L (ref 22–32)
Calcium: 9 mg/dL (ref 8.9–10.3)
Chloride: 106 mmol/L (ref 101–111)
Creatinine, Ser: 0.82 mg/dL (ref 0.44–1.00)
GFR calc Af Amer: >60 mL/min (ref >60)
Glucose, Bld: 159 mg/dL — ABNORMAL HIGH (ref 65–99)
POTASSIUM: 4.1 mmol/L (ref 3.5–5.1)
SODIUM: 142 mmol/L (ref 135–145)

## 2016-09-28 LAB — HEMOGLOBIN AND HEMATOCRIT, BLOOD
HCT: 36.8 % (ref 36.0–46.0)
HEMOGLOBIN: 12.8 g/dL (ref 12.0–15.0)

## 2016-09-28 NOTE — Progress Notes (Signed)
D/c to home w/ dtr voices verbal understanding of d/c instructions.voices no c/o.

## 2016-10-11 ENCOUNTER — Other Ambulatory Visit: Payer: Self-pay | Admitting: Urology

## 2016-10-12 ENCOUNTER — Encounter (HOSPITAL_BASED_OUTPATIENT_CLINIC_OR_DEPARTMENT_OTHER): Payer: Self-pay | Admitting: *Deleted

## 2016-10-12 NOTE — Progress Notes (Signed)
NPO AFTER MN.  ARRIVE AT 0900.  CURRENT LAB RESULTS IN CHART AND EPIC.  WILL TAKE PAXIL AM DOS W/ SIPS OF WATER.

## 2016-10-16 NOTE — H&P (Signed)
Office Visit Report     10/06/2016   --------------------------------------------------------------------------------   Tiffany ShawlLorri Moody  MRN: [id number]  PRIMARY CARE:  Ralene Okoy Moreira, MD  DOB: 24-May-1961, 56 year old Female  REFERRING:  W Lodema HongScott Bowie, MD  PROVIDER:  Resident Resident    SUPERVISING:  Ihor GullyMark Ottelin, M.D.    TREATING:  Jalene MulletMatthew Remingtyn Depaola, M.D.    LOCATION:  [facility] [phone]   --------------------------------------------------------------------------------   CC/HPI: CC - Postop left PCNL   55yo female s/p left PCNL 09/27/16 for 2cm left renal pelvis, no PCN required. Discharged home POD1 in good condition. Has done well since besides urinary frequency and back pain due to ureteral stent discomfort. No nausea, emesis, fevers. Ready for stent to be removed. First time stone former. Made a switch in her fluid intake about a year ago where she started increasing water & lime, gatorade and minimizing tea/mt dew.   No stone analysis available. Counseled patient on prevention of future stones.     ALLERGIES: oxycodone    MEDICATIONS: Clonazepam  Paroxetine Hcl 40 mg tablet     GU PSH: Hysterectomy PCNL, Left - 09/27/2016      PSH Notes: removal of melanoma     NON-GU PSH: Back Surgery (Unspecified) Bilateral Tubal Ligation Carpal Tunnel Surgery.Marland Kitchen. C-Section    GU PMH: Flank Pain - 09/15/2016 Kidney Stone - 09/15/2016    NON-GU PMH: Anxiety Arthritis Depression Gout Hypercholesterolemia Skin Cancer, History    FAMILY HISTORY: Cancer - Father, Mother   SOCIAL HISTORY: Marital Status: Single Current Smoking Status: Patient smokes.   Tobacco Use Assessment Completed: Used Tobacco in last 30 days? Drinks 1 drink per week.  Drinks 1 caffeinated drink per day. 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     Notes: 2 daughters    REVIEW OF SYSTEMS:    GU Review Female:   Patient reports frequent urination, hard to postpone urination, and get up at night to  urinate. Patient denies burning /pain with urination, leakage of urine, stream starts and stops, trouble starting your stream, have to strain to urinate, and currently pregnant.  Gastrointestinal (Upper):   Patient denies nausea, vomiting, and indigestion/ heartburn.  Gastrointestinal (Lower):   Patient denies diarrhea and constipation.  Constitutional:   Patient denies fever, night sweats, weight loss, and fatigue.  Skin:   Patient denies skin rash/ lesion and itching.  Eyes:   Patient denies blurred vision and double vision.  Ears/ Nose/ Throat:   Patient denies sore throat and sinus problems.  Hematologic/Lymphatic:   Patient denies swollen glands and easy bruising.  Cardiovascular:   Patient denies leg swelling and chest pains.  Respiratory:   Patient denies cough and shortness of breath.  Endocrine:   Patient denies excessive thirst.  Musculoskeletal:   Patient denies back pain and joint pain.  Neurological:   Patient denies headaches and dizziness.  Psychologic:   Patient reports depression and anxiety.    VITAL SIGNS:      10/06/2016 01:08 PM  Weight 224 lb / 101.6 kg  Height 69 in / 175.26 cm  BP 126/83 mmHg  Pulse 73 /min  Temperature 98.0 F / 37 C  BMI 33.1 kg/m   GU PHYSICAL EXAMINATION:    External Genitalia: No hirsutism, no rash, no scarring, no cyst, no erythematous lesion, no papular lesion, no blanched lesion, no warty lesion. No edema.  Urethral Meatus: Normal size. Normal position. No discharge.  Urethra: No tenderness, no mass, no scarring. No hypermobility. No leakage.  Bladder: Normal to palpation, no tenderness, no mass, normal size.   Notes: Left back incision healing well. 2 nylon sutures removed.    MULTI-SYSTEM PHYSICAL EXAMINATION:    Constitutional: Well-nourished. No physical deformities. Normally developed. Good grooming.  Neck: Neck symmetrical, not swollen. Normal tracheal position.  Respiratory: No labored breathing, no use of accessory muscles.    Cardiovascular: Normal temperature, normal extremity pulses, no swelling, no varicosities.  Lymphatic: No enlargement of neck, axillae, groin.  Skin: No paleness, no jaundice, no cyanosis. No lesion, no ulcer, no rash.  Neurologic / Psychiatric: Oriented to time, oriented to place, oriented to person. No depression, no anxiety, no agitation.  Gastrointestinal: No mass, no tenderness, no rigidity, non obese abdomen.  Eyes: Normal conjunctivae. Normal eyelids.  Ears, Nose, Mouth, and Throat: Left ear no scars, no lesions, no masses. Right ear no scars, no lesions, no masses. Nose no scars, no lesions, no masses. Normal hearing. Normal lips.  Musculoskeletal: Normal gait and station of head and neck.     PAST DATA REVIEWED:  Source Of History:  Patient   PROCEDURES:         Flexible Cystoscopy - 52000  Risks, benefits, and some of the potential complications of the procedure were discussed at length with the patient including infection, bleeding, voiding discomfort, urinary retention, fever, chills, sepsis, and others. All questions were answered. Informed consent was obtained. Antibiotic prophylaxis was given. Sterile technique and intraurethral analgesia were used.  Meatus:  Normal size. Normal location. Normal condition.  Urethra:  No hypermobility. No leakage.  Ureteral Orifices:  Normal location. Normal size. Normal shape. Effluxed clear urine. Edematous mucosa surrounding left ureteral orifice. No left ureteral stent present intravesically.  Bladder:  No trabeculation. No tumors. Normal mucosa. No stones.      The lower urinary tract was carefully examined. The procedure was well-tolerated and without complications. Antibiotic instructions were given. Instructions were given to call the office immediately for bloody urine, difficulty urinating, urinary retention, painful or frequent urination, fever, chills, nausea, vomiting or other illness. The patient stated that she understood these  instructions and would comply with them.         KUB - [id number]  A single view of the abdomen is obtained.               Urinalysis w/Scope Dipstick Dipstick Cont'd Micro  Color: Yellow Bilirubin: Neg WBC/hpf: 10 - 20/hpf  Appearance: Cloudy Ketones: Neg RBC/hpf: >60/hpf  Specific Gravity: 1.015 Blood: 3+ Bacteria: Rare (0-9/hpf)  pH: 7.5 Protein: 1+ Cystals: NS (Not Seen)  Glucose: Neg Urobilinogen: 0.2 Casts: NS (Not Seen)    Nitrites: Neg Trichomonas: Not Present    Leukocyte Esterase: 2+ Mucous: Not Present      Epithelial Cells: 0 - 5/hpf      Yeast: NS (Not Seen)      Sperm: Not Present    ASSESSMENT:      ICD-10 Details  1 GU:   Flank Pain - R10.84   2   Kidney Stone - N20.0           Notes:   56yo female s/p L PCNL on 2/20 for 2cm left renal pelvis stone. Back incision healing well, sutures removed today. Left ureteral stent not present on local cystoscopy. KUB shows presence of stent, migrated proximally into ureter.    PLAN:            Medications New Meds: Oxybutynin Chloride 5 mg tablet 1 tablet PO TID PRN  bladder spasms  #30  1 Refill(s)  Tamsulosin Hcl 0.4 mg capsule, ext release 24 hr 1 capsule PO Daily PRN stent discomfort  #15  1 Refill(s)            Orders Labs Urine Culture and Sensitivity  X-Rays: KUB          Schedule         Document Letter(s):  Created for Patient: Clinical Summary         Notes:   KUB today - shows retained and migrated left ureteral stent which was not accessible on local cystoscopy today in clinic. Will post for next available cystourethroscopy with left ureteroscopy & stent removal with Dr. Marlou Porch. Urine culture sent today for preop purposes.   Flomax & ditropan prescribed for ureteral stent discomfort. Adverse effects of each med reviewed.   We discussed stone prevention strategies - increase hydration with goal 2.0L UOP a day, minimize coffee/tea/alcohol/soda intake, decrease sodium/animal protein/oxalate, increase  citrus/fruit/vegetable.   Can pursue 24 hour urine study if she continues to form stones in future, defer for now.   Dr. Vernie Ammons was the supervising physician for this visit.

## 2016-10-19 ENCOUNTER — Ambulatory Visit (HOSPITAL_BASED_OUTPATIENT_CLINIC_OR_DEPARTMENT_OTHER): Payer: Self-pay | Admitting: Anesthesiology

## 2016-10-19 ENCOUNTER — Encounter (HOSPITAL_BASED_OUTPATIENT_CLINIC_OR_DEPARTMENT_OTHER)
Admission: RE | Disposition: A | Discharge status: Home / Self Care | Payer: Self-pay | Source: Ambulatory Visit | Attending: Urology

## 2016-10-19 ENCOUNTER — Encounter (HOSPITAL_BASED_OUTPATIENT_CLINIC_OR_DEPARTMENT_OTHER): Payer: Self-pay | Admitting: *Deleted

## 2016-10-19 ENCOUNTER — Ambulatory Visit (HOSPITAL_BASED_OUTPATIENT_CLINIC_OR_DEPARTMENT_OTHER)
Admission: RE | Admit: 2016-10-19 | Discharge: 2016-10-19 | Disposition: A | Discharge status: Home / Self Care | Payer: Self-pay | Source: Ambulatory Visit | Attending: Urology | Admitting: Urology

## 2016-10-19 DIAGNOSIS — Z79899 Other long term (current) drug therapy: Secondary | ICD-10-CM | POA: Insufficient documentation

## 2016-10-19 DIAGNOSIS — T8384XA Pain from genitourinary prosthetic devices, implants and grafts, initial encounter: Secondary | ICD-10-CM | POA: Insufficient documentation

## 2016-10-19 DIAGNOSIS — F419 Anxiety disorder, unspecified: Secondary | ICD-10-CM | POA: Insufficient documentation

## 2016-10-19 DIAGNOSIS — Z87442 Personal history of urinary calculi: Secondary | ICD-10-CM | POA: Insufficient documentation

## 2016-10-19 DIAGNOSIS — F329 Major depressive disorder, single episode, unspecified: Secondary | ICD-10-CM | POA: Insufficient documentation

## 2016-10-19 DIAGNOSIS — T83123A Displacement of other urinary stents, initial encounter: Secondary | ICD-10-CM | POA: Insufficient documentation

## 2016-10-19 DIAGNOSIS — Z885 Allergy status to narcotic agent status: Secondary | ICD-10-CM | POA: Insufficient documentation

## 2016-10-19 DIAGNOSIS — F17200 Nicotine dependence, unspecified, uncomplicated: Secondary | ICD-10-CM | POA: Insufficient documentation

## 2016-10-19 DIAGNOSIS — Z85820 Personal history of malignant melanoma of skin: Secondary | ICD-10-CM | POA: Insufficient documentation

## 2016-10-19 DIAGNOSIS — Y831 Surgical operation with implant of artificial internal device as the cause of abnormal reaction of the patient, or of later complication, without mention of misadventure at the time of the procedure: Secondary | ICD-10-CM | POA: Insufficient documentation

## 2016-10-19 HISTORY — DX: Urgency of urination: R39.15

## 2016-10-19 HISTORY — DX: Other specified postprocedural states: Z98.890

## 2016-10-19 HISTORY — PX: CYSTOSCOPY WITH URETEROSCOPY: SHX5123

## 2016-10-19 HISTORY — DX: Hyperlipidemia, unspecified: E78.5

## 2016-10-19 HISTORY — DX: Unspecified osteoarthritis, unspecified site: M19.90

## 2016-10-19 HISTORY — DX: Presence of spectacles and contact lenses: Z97.3

## 2016-10-19 HISTORY — DX: Anxiety disorder, unspecified: F41.9

## 2016-10-19 HISTORY — DX: Other specified postprocedural states: Z85.820

## 2016-10-19 HISTORY — DX: Presence of urogenital implants: Z96.0

## 2016-10-19 HISTORY — DX: Bipolar disorder, unspecified: F31.9

## 2016-10-19 LAB — GLUCOSE, CAPILLARY: GLUCOSE-CAPILLARY: 129 mg/dL — AB (ref 65–99)

## 2016-10-19 SURGERY — CYSTOSCOPY WITH URETEROSCOPY
Anesthesia: General | Site: Ureter | Laterality: Left

## 2016-10-19 MED ORDER — DEXAMETHASONE SODIUM PHOSPHATE 4 MG/ML IJ SOLN
INTRAMUSCULAR | Status: DC | PRN
  Administered 2016-10-19: 10 mg via INTRAVENOUS

## 2016-10-19 MED ORDER — FENTANYL CITRATE (PF) 100 MCG/2ML IJ SOLN
25.0000 ug | INTRAMUSCULAR | Status: DC | PRN
  Filled 2016-10-19: qty 1

## 2016-10-19 MED ORDER — DEXAMETHASONE SODIUM PHOSPHATE 10 MG/ML IJ SOLN
INTRAMUSCULAR | Status: AC
  Filled 2016-10-19: qty 1

## 2016-10-19 MED ORDER — CEFAZOLIN SODIUM-DEXTROSE 2-4 GM/100ML-% IV SOLN
INTRAVENOUS | Status: AC
  Filled 2016-10-19: qty 100

## 2016-10-19 MED ORDER — PROPOFOL 10 MG/ML IV BOLUS
INTRAVENOUS | Status: AC
  Filled 2016-10-19: qty 40

## 2016-10-19 MED ORDER — ONDANSETRON HCL 4 MG/2ML IJ SOLN
4.0000 mg | Freq: Once | INTRAMUSCULAR | Status: DC | PRN
  Filled 2016-10-19: qty 2

## 2016-10-19 MED ORDER — CEFAZOLIN IN D5W 1 GM/50ML IV SOLN
1.0000 g | INTRAVENOUS | Status: DC
  Filled 2016-10-19: qty 50

## 2016-10-19 MED ORDER — LACTATED RINGERS IV SOLN
INTRAVENOUS | Status: DC
  Administered 2016-10-19: 10:00:00 via INTRAVENOUS
  Filled 2016-10-19: qty 1000

## 2016-10-19 MED ORDER — LIDOCAINE 2% (20 MG/ML) 5 ML SYRINGE
INTRAMUSCULAR | Status: AC
  Filled 2016-10-19: qty 5

## 2016-10-19 MED ORDER — CEFAZOLIN SODIUM-DEXTROSE 2-4 GM/100ML-% IV SOLN
2.0000 g | INTRAVENOUS | Status: AC
  Administered 2016-10-19: 2 g via INTRAVENOUS
  Filled 2016-10-19: qty 100

## 2016-10-19 MED ORDER — FENTANYL CITRATE (PF) 100 MCG/2ML IJ SOLN
INTRAMUSCULAR | Status: DC | PRN
  Administered 2016-10-19 (×2): 50 ug via INTRAVENOUS

## 2016-10-19 MED ORDER — EPHEDRINE SULFATE-NACL 50-0.9 MG/10ML-% IV SOSY
PREFILLED_SYRINGE | INTRAVENOUS | Status: DC | PRN
  Administered 2016-10-19: 15 mg via INTRAVENOUS

## 2016-10-19 MED ORDER — MIDAZOLAM HCL 2 MG/2ML IJ SOLN
INTRAMUSCULAR | Status: AC
  Filled 2016-10-19: qty 2

## 2016-10-19 MED ORDER — FENTANYL CITRATE (PF) 100 MCG/2ML IJ SOLN
INTRAMUSCULAR | Status: AC
  Filled 2016-10-19: qty 2

## 2016-10-19 MED ORDER — ONDANSETRON HCL 4 MG/2ML IJ SOLN
INTRAMUSCULAR | Status: DC | PRN
  Administered 2016-10-19: 4 mg via INTRAVENOUS

## 2016-10-19 MED ORDER — ARTIFICIAL TEARS OP OINT
TOPICAL_OINTMENT | OPHTHALMIC | Status: AC
  Filled 2016-10-19: qty 3.5

## 2016-10-19 MED ORDER — PHENAZOPYRIDINE HCL 100 MG PO TABS
ORAL_TABLET | ORAL | Status: AC
  Filled 2016-10-19: qty 1

## 2016-10-19 MED ORDER — MIDAZOLAM HCL 5 MG/5ML IJ SOLN
INTRAMUSCULAR | Status: DC | PRN
  Administered 2016-10-19: 2 mg via INTRAVENOUS

## 2016-10-19 MED ORDER — BELLADONNA ALKALOIDS-OPIUM 16.2-60 MG RE SUPP
RECTAL | Status: AC
  Filled 2016-10-19: qty 1

## 2016-10-19 MED ORDER — ONDANSETRON HCL 4 MG/2ML IJ SOLN
INTRAMUSCULAR | Status: AC
  Filled 2016-10-19: qty 2

## 2016-10-19 MED ORDER — SODIUM CHLORIDE 0.9 % IR SOLN
Status: DC | PRN
  Administered 2016-10-19: 1 via INTRAVESICAL

## 2016-10-19 MED ORDER — PHENAZOPYRIDINE HCL 100 MG PO TABS
100.0000 mg | ORAL_TABLET | Freq: Once | ORAL | Status: AC
  Administered 2016-10-19: 100 mg via ORAL
  Filled 2016-10-19: qty 1

## 2016-10-19 MED ORDER — PROPOFOL 10 MG/ML IV BOLUS
INTRAVENOUS | Status: DC | PRN
  Administered 2016-10-19: 180 mg via INTRAVENOUS

## 2016-10-19 MED ORDER — LIDOCAINE 2% (20 MG/ML) 5 ML SYRINGE
INTRAMUSCULAR | Status: DC | PRN
  Administered 2016-10-19: 40 mg via INTRAVENOUS

## 2016-10-19 MED ORDER — EPHEDRINE 5 MG/ML INJ
INTRAVENOUS | Status: AC
  Filled 2016-10-19: qty 10

## 2016-10-19 SURGICAL SUPPLY — 27 items
BAG DRAIN URO-CYSTO SKYTR STRL (DRAIN) ×2 IMPLANT
BASKET DAKOTA 1.9FR 11X120 (BASKET) IMPLANT
BASKET LASER NITINOL 1.9FR (BASKET) IMPLANT
BASKET STNLS GEMINI 4WIRE 3FR (BASKET) IMPLANT
BASKET ZERO TIP NITINOL 2.4FR (BASKET) ×2 IMPLANT
CATH URET 5FR 28IN OPEN ENDED (CATHETERS) ×2 IMPLANT
CATH URET DUAL LUMEN 6-10FR 50 (CATHETERS) IMPLANT
CLOTH BEACON ORANGE TIMEOUT ST (SAFETY) ×2 IMPLANT
FIBER LASER TRAC TIP (UROLOGICAL SUPPLIES) IMPLANT
GLOVE BIO SURGEON STRL SZ7.5 (GLOVE) ×2 IMPLANT
GOWN STRL REUS W/ TWL XL LVL3 (GOWN DISPOSABLE) ×1 IMPLANT
GOWN STRL REUS W/TWL XL LVL3 (GOWN DISPOSABLE) ×1
GUIDEWIRE 0.038 PTFE COATED (WIRE) IMPLANT
GUIDEWIRE ANG ZIPWIRE 038X150 (WIRE) IMPLANT
GUIDEWIRE STR DUAL SENSOR (WIRE) ×2 IMPLANT
IV NS IRRIG 3000ML ARTHROMATIC (IV SOLUTION) ×4 IMPLANT
KIT BALLIN UROMAX 15FX10 (LABEL) IMPLANT
KIT BALLN UROMAX 15FX4 (MISCELLANEOUS) IMPLANT
KIT BALLN UROMAX 26 75X4 (MISCELLANEOUS)
KIT RM TURNOVER CYSTO AR (KITS) ×2 IMPLANT
MANIFOLD NEPTUNE II (INSTRUMENTS) ×2 IMPLANT
NS IRRIG 500ML POUR BTL (IV SOLUTION) ×2 IMPLANT
PACK CYSTO (CUSTOM PROCEDURE TRAY) ×2 IMPLANT
SET HIGH PRES BAL DIL (LABEL)
SHEATH ACCESS URETERAL 38CM (SHEATH) IMPLANT
TUBE CONNECTING 12X1/4 (SUCTIONS) ×2 IMPLANT
TUBE FEEDING 8FR 16IN STR KANG (MISCELLANEOUS) IMPLANT

## 2016-10-19 NOTE — Anesthesia Preprocedure Evaluation (Addendum)
Anesthesia Evaluation  Patient identified by MRN, date of birth, ID band Patient awake    Reviewed: Allergy & Precautions, NPO status , Patient's Chart, lab work & pertinent test results  Airway Mallampati: II  TM Distance: >3 FB Neck ROM: Full    Dental  (+) Teeth Intact, Dental Advisory Given   Pulmonary Current Smoker,  breath sounds clear to auscultation        Cardiovascular Rhythm:Regular Rate:Normal     Neuro/Psych    GI/Hepatic   Endo/Other    Renal/GU      Musculoskeletal   Abdominal   Peds  Hematology   Anesthesia Other Findings   Reproductive/Obstetrics                             Anesthesia Physical Anesthesia Plan  ASA: II  Anesthesia Plan: General   Post-op Pain Management:    Induction: Intravenous  Airway Management Planned: LMA  Additional Equipment:   Intra-op Plan:   Post-operative Plan:   Informed Consent: I have reviewed the patients History and Physical, chart, labs and discussed the procedure including the risks, benefits and alternatives for the proposed anesthesia with the patient or authorized representative who has indicated his/her understanding and acceptance.   Dental advisory given  Plan Discussed with: CRNA and Anesthesiologist  Anesthesia Plan Comments:         Anesthesia Quick Evaluation  

## 2016-10-19 NOTE — Anesthesia Postprocedure Evaluation (Addendum)
Anesthesia Post Note  Patient: [name]  Procedure(s) Performed: Procedure(s) (LRB): CYSTOSCOPY WITH LEFT  URETEROSCOPY WITH URETERAL STENT REMOVAL (Left)  Patient location during evaluation: PACU Anesthesia Type: General Level of consciousness: awake, awake and alert and oriented Pain management: pain level controlled Vital Signs Assessment: post-procedure vital signs reviewed and stable Respiratory status: spontaneous breathing, nonlabored ventilation, respiratory function stable and patient connected to nasal cannula oxygen Cardiovascular status: blood pressure returned to baseline Anesthetic complications: no       Last Vitals:  Vitals:   10/19/16 1104 10/19/16 1115  BP: 134/76 130/70  Pulse: 82 84  Resp: (!) 9 14  Temp: 36.5 C     Last Pain:  Vitals:   10/19/16 0839  TempSrc: Oral                 Shilo Philipson COKER

## 2016-10-19 NOTE — Interval H&P Note (Signed)
History and Physical Interval Note:  10/19/2016 10:09 AM  [name]  has presented today for surgery, with the diagnosis of retained left ureteral stent  The various methods of treatment have been discussed with the patient and family. After consideration of risks, benefits and other options for treatment, the patient has consented to  Procedure(s): CYSTOSCOPY WITH LEFT  URETEROSCOPY WITH URETERAL STENT REMOVAL (Left) as a surgical intervention .  The patient's history has been reviewed, patient examined, no change in status, stable for surgery.  I have reviewed the patient's chart and labs.  Questions were answered to the patient's satisfaction.     Blenda MountsMatthew R Diedra Sinor

## 2016-10-19 NOTE — Op Note (Signed)
Preoperative Diagnosis: Left retained ureteral stent s/p left PCNL  Postoperative Diagnosis:  Same  Procedure(s) Performed:   1. Cystourethroscopy 2. Left ureteroscopy with removal of left ureteral stent, complicated  Teaching Surgeon:  Cordella RegisterBen Yassmine Tamm, MD  Resident Surgeon:  Jalene MulletMatthew Macey, MD  Assistant(s):  None  Anesthesia:  General  Fluids:  See anesthesia record  Estimated blood loss:  0 mL  Specimens:  None  Cultures:  None  Drains:  None  Complications:  None  Indications: 56 year old female status post left percutaneous nephrolithotomy 09/27/2016 who was recently seen in the clinic for a postoperative check where she was noted to have a left ureteral stent that was retracted into the left ureter. She presents today for cystoscopy with left ureteral stent removal. Risk and benefits reviewed with the patient who wishes to proceed.  Findings:  Left ureteral stent with distal curl noted in the distal left ureter successfully removed with a 0 tip nitinol basket with semirigid ureteroscopy.  Description:  The patient was correctly identified in the preop holding area where written informed consent as well potential risk and complication reviewed. She agreed. They were brought back to the operative suite. Once correct information was verified, general anesthesia was induced. They were then gently placed into dorsal lithotomy position with SCDs in place for VTE prophylaxis. They were prepped and draped in the usual sterile fashion and given appropriate preoperative antibiotics with Ancef. A second timeout was then performed.   We inserted a 44F rigid cystoscope per urethra with copious lubrication and normal saline irrigation running. This demonstrated a normal urethra, no bladder tumors, trabeculations, stones or foreign bodies noted. There was mild erythematous changes to the mucosa surrounding the left ureteral orifice. There was no ureteral stent noted within the bladder. We  switched to a semirigid ureteroscope and performed distal ureteroscopy on the left which showed a left ureteral stent with its distal curl noted in the distal ureter. Using a 0 tip nitinol basket we grasped the end of the ureteral stent and removed it successfully without complication. The bladder was emptied with the 22 French rigid cystoscope. The patient was woken up from anesthesia having tolerated the procedure well.   Post Op Plan:   1. Discharge patient home when meets PACU criteria and voids x1. 2. Rx for pyridium 3. Follow up [facility]. If patient wishes to be seen in 8-12 months with imaging (KUB +/- RUS) she was advised to call in. Stone prevention strategies already reviewed in clinic    Jalene MulletMatthew Macey, MD Resident, Department of Urology

## 2016-10-19 NOTE — Anesthesia Procedure Notes (Signed)
Procedure Name: LMA Insertion Date/Time: 10/19/2016 10:27 AM Performed by: Tyrone NineSAUVE, Larayne Baxley F Pre-anesthesia Checklist: Patient identified, Timeout performed, Emergency Drugs available, Suction available and Patient being monitored Patient Re-evaluated:Patient Re-evaluated prior to inductionOxygen Delivery Method: Circle system utilized Preoxygenation: Pre-oxygenation with 100% oxygen Intubation Type: IV induction Ventilation: Mask ventilation without difficulty LMA: LMA inserted LMA Size: 4.0 Number of attempts: 1 Placement Confirmation: breath sounds checked- equal and bilateral and positive ETCO2 Tube secured with: Tape Dental Injury: Teeth and Oropharynx as per pre-operative assessment

## 2016-10-19 NOTE — Transfer of Care (Signed)
Immediate Anesthesia Transfer of Care Note  Patient: [name]  Procedure(s) Performed: Procedure(s): CYSTOSCOPY WITH LEFT  URETEROSCOPY WITH URETERAL STENT REMOVAL (Left)  Patient Location: PACU  Anesthesia Type:General  Level of Consciousness: awake, alert , oriented and patient cooperative  Airway & Oxygen Therapy: Patient Spontanous Breathing and Patient connected to nasal cannula oxygen  Post-op Assessment: Report given to RN and Post -op Vital signs reviewed and stable  Post vital signs: Reviewed and stable  Last Vitals:  Vitals:   10/19/16 0839  BP: 138/70  Pulse: 64  Resp: 16  Temp: 36.6 C    Last Pain:  Vitals:   10/19/16 0839  TempSrc: Oral      Patients Stated Pain Goal: 7 (10/19/16 0922)  Complications: No apparent anesthesia complications

## 2016-10-19 NOTE — Discharge Instructions (Signed)

## 2016-10-20 ENCOUNTER — Encounter (HOSPITAL_BASED_OUTPATIENT_CLINIC_OR_DEPARTMENT_OTHER): Payer: Self-pay | Admitting: Urology

## 2017-02-20 NOTE — Addendum Note (Signed)
Addendum  created 02/20/17 1540 by Erandy Mceachern, MD   Sign clinical note    

## 2017-02-20 NOTE — Anesthesia Postprocedure Evaluation (Deleted)
Anesthesia Post Note  Patient: [name]  Procedure(s) Performed: Procedure(s) (LRB): NEPHROLITHOTOMY PERCUTANEOUS WITH SURGEON ACCESS (Left)     Anesthesia Post Evaluation  Last Vitals:  Vitals:   09/28/16 0204 09/28/16 0611  BP: 127/68 (!) 146/65  Pulse: (!) 102 (!) 105  Resp: 16 16  Temp: 36.9 C 36.9 C    Last Pain:  Vitals:   09/28/16 0611  TempSrc: Oral  PainSc:                  Tery Hoeger,E. Vinicius Brockman

## 2017-02-20 NOTE — Addendum Note (Signed)
Addendum  created 02/20/17 1622 by Squire Withey, MD   Delete clinical note, Sign clinical note    

## 2017-03-30 NOTE — Addendum Note (Signed)
Addendum  created 03/30/17 1139 by Lanyia Jewel, MD   Sign clinical note    

## 2018-02-05 ENCOUNTER — Emergency Department (HOSPITAL_COMMUNITY): Payer: Self-pay

## 2018-02-05 ENCOUNTER — Encounter (HOSPITAL_COMMUNITY): Payer: Self-pay | Admitting: *Deleted

## 2018-02-05 ENCOUNTER — Other Ambulatory Visit: Payer: Self-pay

## 2018-02-05 ENCOUNTER — Emergency Department (HOSPITAL_COMMUNITY)
Admission: EM | Admit: 2018-02-05 | Discharge: 2018-02-05 | Disposition: A | Discharge status: Home / Self Care | Payer: Self-pay | Attending: Emergency Medicine | Admitting: Emergency Medicine

## 2018-02-05 DIAGNOSIS — Z79899 Other long term (current) drug therapy: Secondary | ICD-10-CM | POA: Insufficient documentation

## 2018-02-05 DIAGNOSIS — F17210 Nicotine dependence, cigarettes, uncomplicated: Secondary | ICD-10-CM | POA: Insufficient documentation

## 2018-02-05 DIAGNOSIS — R51 Headache: Secondary | ICD-10-CM | POA: Insufficient documentation

## 2018-02-05 MED ORDER — IBUPROFEN 800 MG PO TABS
800.0000 mg | ORAL_TABLET | Freq: Once | ORAL | Status: AC
  Administered 2018-02-05: 800 mg via ORAL
  Filled 2018-02-05: qty 1

## 2018-02-05 MED ORDER — CYCLOBENZAPRINE HCL 10 MG PO TABS
10.0000 mg | ORAL_TABLET | Freq: Once | ORAL | Status: AC
  Administered 2018-02-05: 10 mg via ORAL
  Filled 2018-02-05: qty 1

## 2018-02-05 MED ORDER — CYCLOBENZAPRINE HCL 10 MG PO TABS: 10.0000 mg | ORAL_TABLET | Freq: Two times a day (BID) | ORAL | 0 refills | Status: DC | PRN

## 2018-02-05 NOTE — ED Notes (Signed)
Pt verbalizes understanding of d/c instructions. Pt received prescriptions. Pt ambulatory at d/c with all belongings and with family.   
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
## 2018-03-26 ENCOUNTER — Encounter (HOSPITAL_COMMUNITY): Payer: Self-pay

## 2018-03-26 ENCOUNTER — Emergency Department (HOSPITAL_COMMUNITY)
Admission: EM | Admit: 2018-03-26 | Discharge: 2018-03-26 | Disposition: A | Discharge status: Home / Self Care | Payer: Self-pay | Attending: Emergency Medicine | Admitting: Emergency Medicine

## 2018-03-26 ENCOUNTER — Other Ambulatory Visit: Payer: Self-pay

## 2018-03-26 DIAGNOSIS — T782XXA Anaphylactic shock, unspecified, initial encounter: Secondary | ICD-10-CM

## 2018-03-26 DIAGNOSIS — F17210 Nicotine dependence, cigarettes, uncomplicated: Secondary | ICD-10-CM | POA: Insufficient documentation

## 2018-03-26 DIAGNOSIS — T63441A Toxic effect of venom of bees, accidental (unintentional), initial encounter: Secondary | ICD-10-CM | POA: Insufficient documentation

## 2018-03-26 DIAGNOSIS — Z79899 Other long term (current) drug therapy: Secondary | ICD-10-CM | POA: Insufficient documentation

## 2018-03-26 MED ORDER — FAMOTIDINE 20 MG PO TABS
20.0000 mg | ORAL_TABLET | Freq: Once | ORAL | Status: AC
  Administered 2018-03-26: 20 mg via ORAL
  Filled 2018-03-26: qty 1

## 2018-03-26 MED ORDER — EPINEPHRINE 0.3 MG/0.3ML IJ SOAJ: 0.3000 mg | Freq: Once | INTRAMUSCULAR | 0 refills | Status: AC

## 2018-03-26 MED ORDER — PREDNISONE 10 MG PO TABS: 40.0000 mg | ORAL_TABLET | Freq: Every day | ORAL | 0 refills | Status: AC

## 2018-03-26 NOTE — Discharge Instructions (Signed)
I recommend follow-up with your family doctor regarding concern for anaphylaxis after bee sting.  Carry epinephrine pen with you at all times.  See the instructions regarding epinephrine pen.  If you get stung by another bee or have concerns for allergic reaction, difficulty breathing, throat swelling sensation or any other concerning symptoms use epinephrine as described by injecting into your thigh.  Return to the emergency department immediately anytime you must use this medication.  Take prednisone as prescribed for the next 2 days.  Return to the emergency department if symptoms worsen.

## 2018-03-26 NOTE — ED Notes (Signed)
Voice sounds clear.

## 2018-03-26 NOTE — ED Triage Notes (Signed)
Pt stats she was stung by yellow jacket and began to have shortness of breath and tongue swelling Now rewsolved agfter given benedryl 50 mg im epi pen, and solumedrol 125 iv.

## 2018-03-26 NOTE — ED Provider Notes (Signed)
[facility] Provider Note   CSN: [id number] Arrival date & time: 03/26/18  1753   History   Chief Complaint No chief complaint on file.   HPI [name] is a 57 y.o. female.  HPI 57 year old female with history of anxiety, bipolar disorder, hyperlipidemia, obesity, and hypertension presents to the emergency department today for evaluation of concerns for anaphylaxis after bee sting. Pt denies any history of anaphylaxis in past. Was stung by bee 2x last week with no reaction. Was mowing the yard today around 3:30 PM whenever she was stung on her right lower leg.  States that shortly after she developed blurry vision, rash across her body described as hives, and throat swelling sensation.  Drove herself to the nearby fire department and EMS was called.  She received IM epinephrine as well as Solu-Medrol prior to ED arrival.  Patient reports feeling at her baseline at this time.  Denies any throat swelling sensation.  Never had shortness of breath or wheezing.  Denies any shortness of breath or wheezing at this time. No other known allergens. Feeling well prior to bee sting. Blurry vision was brief and now resolved.   Past Medical History:  Diagnosis Date  . Anxiety disorder   . Bipolar 1 disorder (HCC)    w/ hx suicide attempt age 89 and [id number]  . Complication of anesthesia    slow to wake  . History of kidney stones   . History of melanoma excision    2004-- BACK  . Hyperlipidemia   . OA (osteoarthritis)    KNEE  . Retained ureteral stent   . Urgency of urination   . Wears glasses     Patient Active Problem List   Diagnosis Date Noted  . Renal stone 09/27/2016    Past Surgical History:  Procedure Laterality Date  . CARPAL TUNNEL RELEASE Right 1996  . CESAREAN SECTION  1992  . CYSTOSCOPY WITH URETEROSCOPY Left 10/19/2016   Procedure: CYSTOSCOPY WITH LEFT  URETEROSCOPY WITH URETERAL STENT REMOVAL;  Surgeon: Crist FatBenjamin W Herrick, MD;   [facility];  Service: Urology;  Laterality: Left;  . LUMBAR MICRODISCECTOMY  1995   L4 -- L5  . NEPHROLITHOTOMY Left 09/27/2016   Procedure: NEPHROLITHOTOMY PERCUTANEOUS WITH SURGEON ACCESS;  Surgeon: Crist FatBenjamin W Herrick, MD;  Location: [facility];  Service: Urology;  Laterality: Left;  . TOTAL ABDOMINAL HYSTERECTOMY W/ BILATERAL SALPINGOOPHORECTOMY  02/06/2009  . TUBAL LIGATION Bilateral 1995     OB History   None      Home Medications    Prior to Admission medications   Medication Sig Start Date End Date Taking? Authorizing Provider  clonazePAM (KLONOPIN) 2 MG tablet Take 2 mg by mouth at bedtime.    [provider]  cyclobenzaprine (FLEXERIL) 10 MG tablet Take 1 tablet (10 mg total) by mouth 2 (two) times daily as needed for muscle spasms. 02/05/18   Kellie ShropshireShrosbree, Emily J, PA-C  EPINEPHrine 0.3 mg/0.3 mL IJ SOAJ injection Inject 0.3 mLs (0.3 mg total) into the muscle once for 1 dose. 03/26/18 03/26/18  Rigoberto Noelickens, Trey Bebee, MD  ibuprofen (ADVIL,MOTRIN) 200 MG tablet Take 200 mg by mouth every 6 (six) hours as needed.    [provider]  PARoxetine (PAXIL) 40 MG tablet Take 40 mg by mouth every morning.     [provider]  predniSONE (DELTASONE) 10 MG tablet Take 4 tablets (40 mg total) by mouth daily for 2 days. 03/26/18 03/28/18  Rigoberto Noel, MD  traMADol Janean Sark) 50 MG tablet Take 1-2 tablets every 6 hours as needed for pain 09/27/16   Carolin Coy, MD    Family History No family history on file.  Social History Social History   Tobacco Use  . Smoking status: Current Every Day Smoker    Packs/day: 0.50    Years: 40.00    Pack years: 20.00    Types: Cigarettes  . Smokeless tobacco: Never Used  Substance Use Topics  . Alcohol use: No  . Drug use: No     Allergies   Oxycodone   Review of Systems Review of Systems  Constitutional: Negative for chills and fever.  HENT: Negative for congestion and sore throat.        Throat  swelling sensation, now resolved.  Eyes: Negative for visual disturbance.  Respiratory: Negative for cough and shortness of breath.   Cardiovascular: Negative for chest pain and leg swelling.  Gastrointestinal: Negative for abdominal pain, diarrhea, nausea and vomiting.  Genitourinary: Negative for dysuria and hematuria.  Musculoskeletal: Negative for back pain and neck pain.  Skin: Positive for rash (hives, scattered. now resolved). Negative for color change.  Neurological: Negative for weakness and headaches.  All other systems reviewed and are negative.    Physical Exam Updated Vital Signs BP 116/76   Pulse 76   Resp 17   Ht 5\' 9"  (1.753 m)   Wt 104.3 kg   SpO2 97%   BMI 33.97 kg/m   Physical Exam  Constitutional: She appears well-developed and well-nourished. No distress.  HENT:  Head: Normocephalic and atraumatic.  No oropharyngeal swelling, rash, lesions, or other abnormalities. Speaking clear sentences fully.  Eyes: Conjunctivae are normal.  Neck: Neck supple.  Cardiovascular: Regular rhythm and intact distal pulses.  Pulmonary/Chest: Effort normal and breath sounds normal. No stridor. No respiratory distress. She has no wheezes.  Abdominal: Soft. She exhibits no distension. There is no tenderness.  Musculoskeletal: She exhibits no edema.  Neurological: She is alert.  Skin: Skin is warm and dry.  Psychiatric: She has a normal mood and affect.  Nursing note and vitals reviewed.    ED Treatments / Results  Labs (all labs ordered are listed, but only abnormal results are displayed) Labs Reviewed - No data to display  EKG None  Radiology No results found.  Procedures Procedures (including critical care time)  Medications Ordered in ED Medications  famotidine (PEPCID) tablet 20 mg (20 mg Oral Given 03/26/18 1925)     Initial Impression / Assessment and Plan / ED Course  I have reviewed the triage vital signs and the nursing notes.  Pertinent labs &  imaging results that were available during my care of the patient were reviewed by me and considered in my medical decision making (see chart for details).    57 year old female with history of anxiety, bipolar disorder, hyperlipidemia, obesity, and hypertension presents to the emergency department today for evaluation of concerns for anaphylaxis after bee sting.   Patient is afebrile and hemodynamically stable at presentation. Clear airway and breath sounds at presentation.  History concerning for anaphylaxis given rash and throat swelling sensation with brief episode blurred vision.  Did receive epinephrine and Solu-Medrol prior to ED arrival.  Also took Benadryl at home.  Will give famotidine. Pt received epi approx 4pm. We will monitor for relapse of anaphylaxis symptoms in ED and plan to discharge on prednisone and epi pen.  Patient does take lisinopril for her hypertension.  She has no signs of angioedema at this time.  Symptoms started immediately after being stung by bee making ACE inhibitor induced angioedema unlikely.  Do not feel that she needs change in blood pressure medicine at this time.  She has no other concerns at this time.  Wound to right lower extremity is well-appearing.  Minimal surrounding erythema.  No retained foreign body appreciated.   Monitored in ED w/no acute events. No return of symptoms or rash. Tolerating PO in ED. Stable for discharge. Strict return precautions provided.   Case and plan of care discussed with Dr. Rubin PayorPickering.  Final Clinical Impressions(s) / ED Diagnoses   Final diagnoses:  Anaphylaxis, initial encounter    ED Discharge Orders         Ordered    predniSONE (DELTASONE) 10 MG tablet  Daily     03/26/18 1945    EPINEPHrine 0.3 mg/0.3 mL IJ SOAJ injection   Once     03/26/18 1945           Rigoberto Noelickens, Lashaunta Sicard, MD 03/26/18 1947    Benjiman CorePickering, Nathan, MD 03/26/18 [phone]

## 2019-08-15 ENCOUNTER — Encounter: Payer: Self-pay | Admitting: Diagnostic Neuroimaging

## 2019-09-26 ENCOUNTER — Encounter: Payer: Self-pay | Admitting: Diagnostic Neuroimaging

## 2019-09-30 ENCOUNTER — Telehealth: Payer: Self-pay

## 2019-09-30 NOTE — Telephone Encounter (Signed)
I called patient and LVM to reschedule NCS/EMG. Requested patient call back to reschedule. FYI

## 2019-09-30 NOTE — Telephone Encounter (Signed)
Noted, thanks!

## 2019-09-30 NOTE — Telephone Encounter (Signed)
I called patient on Thursday, 09/26/19, to cancel her NCS/EMG due to inclement weather. I advised her that we would contact her to reschedule. Patient was understanding and appreciative. Please call patient to schedule with any provider, thanks.

## 2019-11-07 ENCOUNTER — Encounter: Payer: Self-pay | Admitting: Diagnostic Neuroimaging

## 2020-03-26 ENCOUNTER — Encounter: Payer: BLUE CROSS/BLUE SHIELD | Admitting: Diagnostic Neuroimaging

## 2020-11-10 ENCOUNTER — Other Ambulatory Visit: Payer: Self-pay

## 2020-11-10 ENCOUNTER — Ambulatory Visit (HOSPITAL_COMMUNITY)
Admission: EM | Admit: 2020-11-10 | Discharge: 2020-11-10 | Disposition: A | Discharge status: Home / Self Care | Payer: No Typology Code available for payment source

## 2020-11-10 ENCOUNTER — Encounter (HOSPITAL_COMMUNITY): Payer: Self-pay | Admitting: Emergency Medicine

## 2020-11-10 ENCOUNTER — Ambulatory Visit (INDEPENDENT_AMBULATORY_CARE_PROVIDER_SITE_OTHER): Payer: No Typology Code available for payment source

## 2020-11-10 DIAGNOSIS — R059 Cough, unspecified: Secondary | ICD-10-CM

## 2020-11-10 DIAGNOSIS — J189 Pneumonia, unspecified organism: Secondary | ICD-10-CM | POA: Diagnosis not present

## 2020-11-10 DIAGNOSIS — R0602 Shortness of breath: Secondary | ICD-10-CM | POA: Diagnosis not present

## 2020-11-10 DIAGNOSIS — R042 Hemoptysis: Secondary | ICD-10-CM

## 2020-11-10 DIAGNOSIS — J101 Influenza due to other identified influenza virus with other respiratory manifestations: Secondary | ICD-10-CM

## 2020-11-10 DIAGNOSIS — Z720 Tobacco use: Secondary | ICD-10-CM | POA: Diagnosis not present

## 2020-11-10 HISTORY — DX: Disorder of kidney and ureter, unspecified: N28.9

## 2020-11-10 LAB — POC INFLUENZA A AND B ANTIGEN (URGENT CARE ONLY)
Influenza A Ag: POSITIVE — AB
Influenza B Ag: NEGATIVE

## 2020-11-10 MED ORDER — AZITHROMYCIN 250 MG PO TABS: 250.0000 mg | ORAL_TABLET | Freq: Every day | ORAL | 0 refills | Status: DC

## 2020-11-10 NOTE — Discharge Instructions (Signed)
You have influenza A.  Your chest x-ray also shows pneumonia.    Take the antibiotic Zithromax as directed.  Schedule a follow-up appointment with your primary care provider.

## 2020-11-10 NOTE — ED Triage Notes (Signed)
Patient had a restless Saturday night, but woke Sunday with generalized aches and pain, chest congestion, blood tinged, tan/gray.  Attempts to breathe deep causes chest and back pain.  Denies fever.  Patient has had hot and cold spells

## 2020-11-10 NOTE — ED Provider Notes (Signed)
[facility]    CSN: [id number] Arrival date & time: 11/10/20  1318      History   Chief Complaint Chief Complaint  Patient presents with  . Cough    HPI [name] is a 60 y.o. female.   Patient presents with 3-day history of cough, shortness of breath, fatigue, body aches, nasal congestion, runny nose.  She reports coughing up blood-tinged sputum since yesterday.  She is a current every day smoker x40 years.  She has had cold/hot spells but did not have a fever when she took her temperature.  She denies sore throat, vomiting, diarrhea, or other symptoms.  Her medical history includes kidney stones, retained ureteral stent, osteoarthritis, bipolar 1 disorder, anxiety.  The history is provided by the patient and medical records.    Past Medical History:  Diagnosis Date  . Anxiety disorder   . Bipolar 1 disorder (HCC)    w/ hx suicide attempt age 63 and 64  . Complication of anesthesia    slow to wake  . History of kidney stones   . History of melanoma excision    2004-- BACK  . Hyperlipidemia   . OA (osteoarthritis)    KNEE  . Renal disorder   . Retained ureteral stent   . Urgency of urination   . Wears glasses     Patient Active Problem List   Diagnosis Date Noted  . Renal stone 09/27/2016    Past Surgical History:  Procedure Laterality Date  . CARPAL TUNNEL RELEASE Right 1996  . CESAREAN SECTION  1992  . CYSTOSCOPY WITH URETEROSCOPY Left 10/19/2016   Procedure: CYSTOSCOPY WITH LEFT  URETEROSCOPY WITH URETERAL STENT REMOVAL;  Surgeon: Crist Fat, MD;  Location: [facility];  Service: Urology;  Laterality: Left;  . LUMBAR MICRODISCECTOMY  1995   L4 -- L5  . NEPHROLITHOTOMY Left 09/27/2016   Procedure: NEPHROLITHOTOMY PERCUTANEOUS WITH SURGEON ACCESS;  Surgeon: Crist Fat, MD;  Location: [facility];  Service: Urology;  Laterality: Left;  . TOTAL ABDOMINAL HYSTERECTOMY W/ BILATERAL SALPINGOOPHORECTOMY  02/06/2009  . TUBAL  LIGATION Bilateral 1995    OB History   No obstetric history on file.      Home Medications    Prior to Admission medications   Medication Sig Start Date End Date Taking? Authorizing Provider  azithromycin (ZITHROMAX) 250 MG tablet Take 1 tablet (250 mg total) by mouth daily. Take first 2 tablets together, then 1 every day until finished. 11/10/20  Yes Mickie Bail, NP  clonazePAM (KLONOPIN) 2 MG tablet Take 2 mg by mouth at bedtime.   Yes [provider]  lisinopril (ZESTRIL) 10 MG tablet  11/04/19  Yes [provider]  PARoxetine (PAXIL) 40 MG tablet Take 40 mg by mouth every morning.    Yes [provider]  rosuvastatin (CRESTOR) 10 MG tablet  11/04/19  Yes [provider]  cyclobenzaprine (FLEXERIL) 10 MG tablet Take 1 tablet (10 mg total) by mouth 2 (two) times daily as needed for muscle spasms. 02/05/18   Kellie Shropshire, PA-C  ibuprofen (ADVIL,MOTRIN) 200 MG tablet Take 200 mg by mouth every 6 (six) hours as needed.    [provider]  traMADol Janean Sark) 50 MG tablet Take 1-2 tablets every 6 hours as needed for pain 09/27/16   Carolin Coy, MD    Family History History reviewed. No pertinent family history.  Social History Social History   Tobacco Use  . Smoking status:  Current Every Day Smoker    Packs/day: 0.50    Years: 40.00    Pack years: 20.00    Types: Cigarettes  . Smokeless tobacco: Never Used  Vaping Use  . Vaping Use: Never used  Substance Use Topics  . Alcohol use: No  . Drug use: No     Allergies   Oxycodone and Sulfa antibiotics   Review of Systems Review of Systems  Constitutional: Positive for fatigue. Negative for chills and fever.  HENT: Positive for congestion and rhinorrhea. Negative for ear pain and sore throat.   Eyes: Negative for pain and visual disturbance.  Respiratory: Positive for cough and shortness of breath.   Cardiovascular: Negative for chest pain and palpitations.   Gastrointestinal: Negative for abdominal pain, diarrhea and vomiting.  Genitourinary: Negative for dysuria and hematuria.  Musculoskeletal: Negative for arthralgias and back pain.  Skin: Negative for color change and rash.  Neurological: Negative for seizures and syncope.  All other systems reviewed and are negative.    Physical Exam Triage Vital Signs ED Triage Vitals  Enc Vitals Group     BP      Pulse      Resp      Temp      Temp src      SpO2      Weight      Height      Head Circumference      Peak Flow      Pain Score      Pain Loc      Pain Edu?      Excl. in GC?    No data found.  Updated Vital Signs BP 120/72 (BP Location: Right Arm) Comment (BP Location): large cuff  Pulse 77   Temp 98.8 F (37.1 C) (Oral)   Resp (!) 21   SpO2 98%   Visual Acuity Right Eye Distance:   Left Eye Distance:   Bilateral Distance:    Right Eye Near:   Left Eye Near:    Bilateral Near:     Physical Exam Vitals and nursing note reviewed.  Constitutional:      General: She is not in acute distress.    Appearance: She is well-developed. She is ill-appearing.  HENT:     Head: Normocephalic and atraumatic.     Right Ear: Tympanic membrane normal.     Left Ear: Tympanic membrane normal.     Nose: Congestion and rhinorrhea present.     Mouth/Throat:     Mouth: Mucous membranes are moist.     Pharynx: Oropharynx is clear.  Eyes:     Conjunctiva/sclera: Conjunctivae normal.  Cardiovascular:     Rate and Rhythm: Normal rate and regular rhythm.     Heart sounds: Normal heart sounds.  Pulmonary:     Effort: Pulmonary effort is normal. No respiratory distress.     Breath sounds: Normal breath sounds.     Comments: Breath sounds diminished throughout. Abdominal:     Palpations: Abdomen is soft.     Tenderness: There is no abdominal tenderness.  Musculoskeletal:     Cervical back: Neck supple.  Skin:    General: Skin is warm and dry.  Neurological:     General: No  focal deficit present.     Mental Status: She is alert and oriented to person, place, and time.     Gait: Gait normal.  Psychiatric:        Mood and Affect: Mood normal.  Behavior: Behavior normal.      UC Treatments / Results  Labs (all labs ordered are listed, but only abnormal results are displayed) Labs Reviewed  POC INFLUENZA A AND B ANTIGEN (URGENT CARE ONLY) - Abnormal; Notable for the following components:      Result Value   Influenza A Ag POSITIVE (*)    All other components within normal limits    EKG   Radiology DG Chest 2 View  Result Date: 11/10/2020 CLINICAL DATA:  Cough and shortness of breath EXAM: CHEST - 2 VIEW COMPARISON:  August 22, 2015 FINDINGS: The heart size and mediastinal contours are within normal limits. Mildly hazy airspace opacity is seen at the right lung base. Aortic knob calcifications are seen. The visualized skeletal structures are unremarkable. IMPRESSION: Mild hazy airspace opacity at the right lung base. This could be due to atelectasis or infectious etiology. Electronically Signed   By: Jonna Clark M.D.   On: 11/10/2020 16:13    Procedures Procedures (including critical care time)  Medications Ordered in UC Medications - No data to display  Initial Impression / Assessment and Plan / UC Course  I have reviewed the triage vital signs and the nursing notes.  Pertinent labs & imaging results that were available during my care of the patient were reviewed by me and considered in my medical decision making (see chart for details).   Influenza A, right lower lobe pneumonia.  Patient declined COVID test.  No respiratory distress, O2 sat 98%.  Patient is outside the window of treatment for with Tamiflu (> 48 hours).  Chest x-ray shows "mild hazy airspace opacity at the right lung base."  Due to patient's 40-year history of smoking and possible infectious etiology of opacity, treating with Zithromax.  Also discussed other symptomatic  treatment including Tylenol or ibuprofen, rest, hydration.  Instructed patient to follow-up with her PCP in the next week.  Patient agrees to plan of care.   Final Clinical Impressions(s) / UC Diagnoses   Final diagnoses:  Influenza A  Pneumonia of right lower lobe due to infectious organism     Discharge Instructions     You have influenza A.  Your chest x-ray also shows pneumonia.    Take the antibiotic Zithromax as directed.  Schedule a follow-up appointment with your primary care provider.        ED Prescriptions    Medication Sig Dispense Auth. Provider   azithromycin (ZITHROMAX) 250 MG tablet Take 1 tablet (250 mg total) by mouth daily. Take first 2 tablets together, then 1 every day until finished. 6 tablet Mickie Bail, NP     PDMP not reviewed this encounter.   Mickie Bail, NP 11/10/20 1626

## 2020-12-25 ENCOUNTER — Ambulatory Visit (HOSPITAL_COMMUNITY)
Admission: EM | Admit: 2020-12-25 | Discharge: 2020-12-25 | Disposition: A | Discharge status: Home / Self Care | Payer: No Typology Code available for payment source | Attending: Physician Assistant | Admitting: Physician Assistant

## 2020-12-25 ENCOUNTER — Encounter (HOSPITAL_COMMUNITY): Payer: Self-pay | Admitting: Emergency Medicine

## 2020-12-25 ENCOUNTER — Other Ambulatory Visit: Payer: Self-pay

## 2020-12-25 ENCOUNTER — Ambulatory Visit (INDEPENDENT_AMBULATORY_CARE_PROVIDER_SITE_OTHER): Payer: No Typology Code available for payment source

## 2020-12-25 DIAGNOSIS — M25562 Pain in left knee: Secondary | ICD-10-CM

## 2020-12-25 MED ORDER — MELOXICAM 15 MG PO TABS: 15.0000 mg | ORAL_TABLET | Freq: Every day | ORAL | 0 refills | Status: DC

## 2020-12-25 NOTE — Discharge Instructions (Signed)
Take Mobic daily to help manage inflammation and pain.  Use brace on affected knee.  Keep this elevated and use ice.  You should not take additional NSAIDs including aspirin, ibuprofen/Advil, naproxen/Aleve with this medication due to risk of GI bleeding.  If your symptoms do not improve please follow-up with sports medicine as discussed.

## 2020-12-25 NOTE — ED Provider Notes (Signed)
[facility]    CSN: [id number] Arrival date & time: 12/25/20  1124      History   Chief Complaint Chief Complaint  Patient presents with  . Knee Pain    left    HPI [name] is a 60 y.o. female.   Patient presents today with a 4 to 5-day history of left knee pain.  Denies any known injury but does report that she has been more physically active recently lifting boxes but denies any specific time where she felt pain in her knee.  States is gradually been worsening and currently pain is rated 8 on a 0-10 pain scale, localized to medial right knee, described as aching, worse with certain activities or movements.  She has tried ibuprofen 800 mg without improvement of symptoms as well as a knee brace.  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  She is followed by a PCP who provides intra-articular injections as needed on average 1 time per year.  States current symptoms are not seen in previous episodes of this condition.  Denies previous left knee injury or surgery.  Denies any popping, clicking, instability, difficulty ambulating.     Past Medical History:  Diagnosis Date  . Anxiety disorder   . Bipolar 1 disorder (HCC)    w/ hx suicide attempt age 38 and 76  . Complication of anesthesia    slow to wake  . History of kidney stones   . History of melanoma excision    2004-- BACK  . Hyperlipidemia   . OA (osteoarthritis)    KNEE  . Renal disorder   . Retained ureteral stent   . Urgency of urination   . Wears glasses     Patient Active Problem List   Diagnosis Date Noted  . Renal stone 09/27/2016    Past Surgical History:  Procedure Laterality Date  . CARPAL TUNNEL RELEASE Right 1996  . CESAREAN SECTION  1992  . CYSTOSCOPY WITH URETEROSCOPY Left 10/19/2016   Procedure: CYSTOSCOPY WITH LEFT  URETEROSCOPY WITH URETERAL STENT REMOVAL;  Surgeon: Crist Fat, MD;  Location: [facility];  Service: Urology;  Laterality: Left;  . LUMBAR MICRODISCECTOMY  1995   L4 -- L5  . NEPHROLITHOTOMY Left 09/27/2016   Procedure: NEPHROLITHOTOMY PERCUTANEOUS WITH SURGEON ACCESS;  Surgeon: Crist Fat, MD;  Location: [facility];  Service: Urology;  Laterality: Left;  . TOTAL ABDOMINAL HYSTERECTOMY W/ BILATERAL SALPINGOOPHORECTOMY  02/06/2009  . TUBAL LIGATION Bilateral 1995    OB History   No obstetric history on file.      Home Medications    Prior to Admission medications   Medication Sig Start Date End Date Taking? Authorizing Provider  clonazePAM (KLONOPIN) 2 MG tablet Take 2 mg by mouth at bedtime.   Yes [provider]  ibuprofen (ADVIL,MOTRIN) 200 MG tablet Take 200 mg by mouth every 6 (six) hours as needed.   Yes [provider]  lisinopril (ZESTRIL) 10 MG tablet  11/04/19  Yes [provider]  meloxicam (MOBIC) 15 MG tablet Take 1 tablet (15 mg total) by mouth daily. 12/25/20  Yes Avianah Pellman K, PA-C  PARoxetine (PAXIL) 40 MG tablet Take 40 mg by mouth every morning.    Yes [provider]  rosuvastatin (CRESTOR) 10 MG tablet  11/04/19  Yes [provider]  cyclobenzaprine (FLEXERIL) 10 MG tablet Take 1 tablet (10 mg  total) by mouth 2 (two) times daily as needed for muscle spasms. 02/05/18   Kellie Shropshire, PA-C  traMADol Janean Sark) 50 MG tablet Take 1-2 tablets every 6 hours as needed for pain 09/27/16   Carolin Coy, MD    Family History History reviewed. No pertinent family history.  Social History Social History   Tobacco Use  . Smoking status: Current Every Day Smoker    Packs/day: 0.50    Years: 40.00    Pack years: 20.00    Types: Cigarettes  . Smokeless tobacco: Never Used  Vaping Use  . Vaping Use: Never used  Substance Use Topics  . Alcohol use: No  . Drug use: No     Allergies   Oxycodone and Sulfa antibiotics   Review of Systems Review of Systems  Constitutional: Positive for activity  change. Negative for appetite change, fatigue and fever.  Respiratory: Negative for cough and shortness of breath.   Cardiovascular: Negative for chest pain.  Gastrointestinal: Negative for abdominal pain, diarrhea, nausea and vomiting.  Musculoskeletal: Positive for arthralgias, gait problem and myalgias. Negative for back pain and joint swelling.  Neurological: Negative for dizziness, weakness, light-headedness, numbness and headaches.     Physical Exam Triage Vital Signs ED Triage Vitals  Enc Vitals Group     BP 12/25/20 1220 (!) 144/84     Pulse Rate 12/25/20 1220 68     Resp 12/25/20 1220 20     Temp 12/25/20 1220 98.1 F (36.7 C)     Temp Source 12/25/20 1220 Oral     SpO2 12/25/20 1220 95 %     Weight --      Height --      Head Circumference --      Peak Flow --      Pain Score 12/25/20 1216 8     Pain Loc --      Pain Edu? --      Excl. in GC? --    No data found.  Updated Vital Signs BP (!) 144/84 (BP Location: Right Arm)   Pulse 68   Temp 98.1 F (36.7 C) (Oral)   Resp 20   SpO2 95%   Visual Acuity Right Eye Distance:   Left Eye Distance:   Bilateral Distance:    Right Eye Near:   Left Eye Near:    Bilateral Near:     Physical Exam Vitals reviewed.  Constitutional:      General: She is awake. She is not in acute distress.    Appearance: Normal appearance. She is not ill-appearing.     Comments: Very pleasant female appears stated age in no acute distress  HENT:     Head: Normocephalic and atraumatic.  Cardiovascular:     Rate and Rhythm: Normal rate and regular rhythm.     Pulses:          Posterior tibial pulses are 2+ on the right side and 2+ on the left side.     Heart sounds: No murmur heard.   Pulmonary:     Effort: Pulmonary effort is normal.     Breath sounds: Normal breath sounds. No wheezing, rhonchi or rales.     Comments: Clear to auscultation bilaterally Abdominal:     Palpations: Abdomen is soft.     Tenderness: There is no  abdominal tenderness.  Musculoskeletal:     Left knee: Bony tenderness present. No swelling or crepitus. Decreased range of motion. Tenderness present over the medial joint  line. No LCL laxity, MCL laxity, ACL laxity or PCL laxity.    Instability Tests: Anterior drawer test negative. Posterior drawer test negative. Medial McMurray test negative and lateral McMurray test negative.     Right lower leg: No edema.     Left lower leg: No edema.     Comments: Left knee: No significant swelling or effusion.  Decreased range of motion with extension and flexion beyond 90 degrees.  Tenderness palpation over medial joint line.  No ligamentous laxity on exam.  Psychiatric:        Behavior: Behavior is cooperative.      UC Treatments / Results  Labs (all labs ordered are listed, but only abnormal results are displayed) Labs Reviewed - No data to display  EKG   Radiology DG Knee Complete 4 Views Left  Result Date: 12/25/2020 CLINICAL DATA:  Left knee pain and tenderness for 5 days. EXAM: LEFT KNEE - COMPLETE 4+ VIEW COMPARISON:  None. FINDINGS: No evidence of fracture, dislocation, or joint effusion. No evidence of arthropathy or other focal bone abnormality. Soft tissues are unremarkable. IMPRESSION: Negative. Electronically Signed   By: Danae Orleans M.D.   On: 12/25/2020 13:41    Procedures Procedures (including critical care time)  Medications Ordered in UC Medications - No data to display  Initial Impression / Assessment and Plan / UC Course  I have reviewed the triage vital signs and the nursing notes.  Pertinent labs & imaging results that were available during my care of the patient were reviewed by me and considered in my medical decision making (see chart for details).      X-ray showed no significant abnormalities.  Patient was prescribed Mobic to help with pain and inflammation with instruction not to take additional NSAIDs with this medication due to risk of GI bleeding.   Recommended she use a brace, elevation, ice for additional symptom relief.  She was given contact information for sports medicine provider and encouraged to follow-up with them if symptoms persist.  Strict return precautions given to which patient expressed understanding.  Final Clinical Impressions(s) / UC Diagnoses   Final diagnoses:  Acute pain of left knee     Discharge Instructions     Take Mobic daily to help manage inflammation and pain.  Use brace on affected knee.  Keep this elevated and use ice.  You should not take additional NSAIDs including aspirin, ibuprofen/Advil, naproxen/Aleve with this medication due to risk of GI bleeding.  If your symptoms do not improve please follow-up with sports medicine as discussed.    ED Prescriptions    Medication Sig Dispense Auth. Provider   meloxicam (MOBIC) 15 MG tablet Take 1 tablet (15 mg total) by mouth daily. 10 tablet Steph Cheadle, Noberto Retort, PA-C     PDMP not reviewed this encounter.   Jeani Hawking, PA-C 12/25/20 1351

## 2020-12-25 NOTE — ED Triage Notes (Signed)
Pt presents today with c/o of left knee pain x 5 days. Denies injury. +ambulatory.

## 2022-01-05 ENCOUNTER — Other Ambulatory Visit: Payer: Self-pay | Admitting: Family Medicine

## 2022-01-05 DIAGNOSIS — G4452 New daily persistent headache (NDPH): Secondary | ICD-10-CM

## 2022-01-29 ENCOUNTER — Other Ambulatory Visit: Payer: No Typology Code available for payment source

## 2022-02-07 ENCOUNTER — Encounter: Payer: Self-pay | Admitting: Neurology

## 2022-04-06 ENCOUNTER — Ambulatory Visit: Payer: No Typology Code available for payment source | Admitting: Podiatry

## 2022-05-11 ENCOUNTER — Encounter: Payer: Self-pay | Admitting: Internal Medicine

## 2022-06-08 ENCOUNTER — Encounter: Payer: No Typology Code available for payment source | Admitting: Internal Medicine

## 2022-07-14 ENCOUNTER — Ambulatory Visit: Payer: No Typology Code available for payment source | Admitting: Neurology

## 2022-11-21 ENCOUNTER — Ambulatory Visit: Payer: Medicaid Other | Admitting: Cardiology

## 2022-11-22 ENCOUNTER — Emergency Department (HOSPITAL_COMMUNITY)
Admission: EM | Admit: 2022-11-22 | Discharge: 2022-11-22 | Disposition: A | Discharge status: Home / Self Care | Payer: Medicaid Other | Attending: Emergency Medicine | Admitting: Emergency Medicine

## 2022-11-22 ENCOUNTER — Other Ambulatory Visit: Payer: Self-pay

## 2022-11-22 ENCOUNTER — Emergency Department (HOSPITAL_COMMUNITY): Payer: Medicaid Other

## 2022-11-22 DIAGNOSIS — S43102A Unspecified dislocation of left acromioclavicular joint, initial encounter: Secondary | ICD-10-CM | POA: Diagnosis not present

## 2022-11-22 DIAGNOSIS — E041 Nontoxic single thyroid nodule: Secondary | ICD-10-CM | POA: Diagnosis not present

## 2022-11-22 DIAGNOSIS — S4992XA Unspecified injury of left shoulder and upper arm, initial encounter: Secondary | ICD-10-CM | POA: Diagnosis present

## 2022-11-22 DIAGNOSIS — W109XXA Fall (on) (from) unspecified stairs and steps, initial encounter: Secondary | ICD-10-CM | POA: Diagnosis not present

## 2022-11-22 MED ORDER — ACETAMINOPHEN 325 MG PO TABS
650.0000 mg | ORAL_TABLET | Freq: Once | ORAL | Status: AC
  Administered 2022-11-22: 650 mg via ORAL
  Filled 2022-11-22: qty 2

## 2022-11-22 NOTE — ED Triage Notes (Signed)
Pt arrives with L shoulder deformity after falling down 7 stairs. She thinks fall was potentially caused by tripping over her dogs. Did hit head but did not pass out and is on no blood thinners.

## 2022-11-22 NOTE — ED Provider Notes (Signed)
[place] [facility] AT [facility] Provider Note   CSN: [id number] Arrival date & time: 11/22/22  1046     History  Chief Complaint  Patient presents with   Shoulder Injury    [name] is a 62 y.o. female.   Shoulder Injury  Patient presents after left shoulder injury.  Reportedly fell over her dog and fell down 7 stairs.  States she did hit her head but no loss conscious.  No neck pain.  No blood thinners.  Pain primarily left shoulder.  No numbness or weakness.      Past Medical History:  Diagnosis Date   Anxiety disorder    Bipolar 1 disorder (HCC)    w/ hx suicide attempt age 41 and 14   Complication of anesthesia    slow to wake   History of kidney stones    History of melanoma excision    2004-- BACK   Hyperlipidemia    OA (osteoarthritis)    KNEE   Renal disorder    Retained ureteral stent    Urgency of urination    Wears glasses     Home Medications Prior to Admission medications   Medication Sig Start Date End Date Taking? Authorizing Provider  atorvastatin (LIPITOR) 20 MG tablet Take 20 mg by mouth daily.    [provider]  clonazePAM (KLONOPIN) 2 MG tablet Take 2 mg by mouth at bedtime.    [provider]  cyclobenzaprine (FLEXERIL) 10 MG tablet Take 1 tablet (10 mg total) by mouth 2 (two) times daily as needed for muscle spasms. 02/05/18   Kellie Shropshire, PA-C  hydrOXYzine (VISTARIL) 25 MG capsule Take 25 mg by mouth 2 (two) times daily as needed for anxiety.    [provider]  ibuprofen (ADVIL,MOTRIN) 200 MG tablet Take 200 mg by mouth every 6 (six) hours as needed.    [provider]  JARDIANCE 10 MG TABS tablet Take 1 tablet by mouth daily.    [provider]  lisinopril (ZESTRIL) 10 MG tablet  11/04/19   [provider]  losartan-hydrochlorothiazide (HYZAAR) 50-12.5 MG tablet Take 1 tablet by mouth daily.    [provider]  meloxicam (MOBIC) 15 MG tablet  Take 1 tablet (15 mg total) by mouth daily. 12/25/20   Raspet, Noberto Retort, PA-C  metFORMIN (GLUCOPHAGE-XR) 500 MG 24 hr tablet Take 1 tablet by mouth in the morning and at bedtime. 04/27/22   [provider]  naproxen (NAPROSYN) 500 MG tablet Take 1 tablet by mouth 2 (two) times daily. 11/02/22   [provider]  PARoxetine (PAXIL) 40 MG tablet Take 40 mg by mouth every morning.     [provider]  potassium chloride (KLOR-CON) 10 MEQ tablet Take 10 mEq by mouth daily. 10/11/22   [provider]  REXULTI 4 MG TABS Take 1 tablet by mouth daily.    [provider]  rosuvastatin (CRESTOR) 10 MG tablet  11/04/19   [provider]  RYBELSUS 3 MG TABS Take 1 tablet by mouth daily.    [provider]  sertraline (ZOLOFT) 100 MG tablet Take 150 mg by mouth daily. 08/02/22   [provider]  traMADol Janean Sark) 50 MG tablet Take 1-2 tablets every 6 hours as needed for pain 09/27/16   Carolin Coy, MD      Allergies    Oxycodone and Sulfa antibiotics    Review of Systems   Review of Systems  Physical Exam Updated Vital Signs BP 114/69   Pulse 75   Temp 97.9 F (36.6 C) (Oral)   Resp 16   SpO2 99%  Physical Exam Vitals and nursing note reviewed.  HENT:     Head: Atraumatic.  Eyes:     Extraocular Movements: Extraocular movements intact.  Cardiovascular:     Rate and Rhythm: Regular rhythm.  Chest:     Chest wall: No tenderness.  Abdominal:     Tenderness: There is no abdominal tenderness.  Musculoskeletal:        General: Tenderness present.     Cervical back: Neck supple. No tenderness.     Comments: Tenderness over left shoulder superiorly.  Decreased range of motion.  Neurovascular intact distally.  No tenderness over elbow or wrist.  Shoulder appears located.  Neurological:     Mental Status: She is alert.     ED Results / Procedures / Treatments   Labs (all labs ordered are listed, but only abnormal results are  displayed) Labs Reviewed - No data to display  EKG None  Radiology CT HEAD WO CONTRAST ( )  Result Date: 11/22/2022 CLINICAL DATA:  Fall down stairs EXAM: CT HEAD WITHOUT CONTRAST CT CERVICAL SPINE WITHOUT CONTRAST TECHNIQUE: Multidetector CT imaging of the head and cervical spine was performed following the standard protocol without intravenous contrast. Multiplanar CT image reconstructions of the cervical spine were also generated. RADIATION DOSE REDUCTION: This exam was performed according to the departmental dose-optimization program which includes automated exposure control, adjustment of the mA and/or kV according to patient size and/or use of iterative reconstruction technique. COMPARISON:  02/05/2018 CT head and cervical spine FINDINGS: CT HEAD FINDINGS Brain: No evidence of acute infarct, hemorrhage, mass, mass effect, or midline shift. No hydrocephalus or extra-axial fluid collection. Vascular: No hyperdense vessel. Skull: Negative for fracture or focal lesion. Sinuses/Orbits: No acute finding. Other: The mastoid air cells are well aerated. CT CERVICAL SPINE FINDINGS Alignment: No listhesis. Skull base and vertebrae: No acute fracture. No primary bone lesion or focal pathologic process. Soft tissues and spinal canal: No prevertebral fluid or swelling. No visible canal hematoma. Disc levels: Degenerative changes in the cervical spine. Disc height loss and calcified disc protrusion at C5-C6 causes moderate spinal canal stenosis. Multilevel facet and uncovertebral hypertrophy, which causes up to moderate neural foraminal narrowing, noted on the left at C5-C6. Upper chest: Hypodensity in the left thyroid, which measures approximately 1.5 cm. No acute finding in the lung apices. IMPRESSION: 1. No acute intracranial process. 2. No acute fracture or traumatic malalignment in the cervical spine. 3. 1.5 cm hypodense nodule in the left thyroid. If this has not previously been evaluated, a non-emergent  ultrasound of the thyroid is recommended. (Reference: J Am Coll Radiol. 2015 Feb;12(2): 143-50) 4. Degenerative changes in the cervical spine, worst at C5-C6, where there is moderate spinal canal stenosis and moderate left neural foraminal narrowing. Electronically Signed   By: Wiliam Ke M.D.   On: 11/22/2022 13:18   CT Cervical Spine Wo Contrast  Result Date: 11/22/2022 CLINICAL DATA:  Fall down stairs EXAM: CT HEAD WITHOUT CONTRAST CT CERVICAL SPINE WITHOUT CONTRAST TECHNIQUE: Multidetector CT imaging of the head and cervical spine was performed following the standard protocol without intravenous contrast. Multiplanar CT image reconstructions of the cervical spine were also generated. RADIATION DOSE REDUCTION: This exam was performed according to the departmental dose-optimization program which includes automated exposure control, adjustment of the mA and/or kV according to patient size and/or use  of iterative reconstruction technique. COMPARISON:  02/05/2018 CT head and cervical spine FINDINGS: CT HEAD FINDINGS Brain: No evidence of acute infarct, hemorrhage, mass, mass effect, or midline shift. No hydrocephalus or extra-axial fluid collection. Vascular: No hyperdense vessel. Skull: Negative for fracture or focal lesion. Sinuses/Orbits: No acute finding. Other: The mastoid air cells are well aerated. CT CERVICAL SPINE FINDINGS Alignment: No listhesis. Skull base and vertebrae: No acute fracture. No primary bone lesion or focal pathologic process. Soft tissues and spinal canal: No prevertebral fluid or swelling. No visible canal hematoma. Disc levels: Degenerative changes in the cervical spine. Disc height loss and calcified disc protrusion at C5-C6 causes moderate spinal canal stenosis. Multilevel facet and uncovertebral hypertrophy, which causes up to moderate neural foraminal narrowing, noted on the left at C5-C6. Upper chest: Hypodensity in the left thyroid, which measures approximately 1.5 cm. No  acute finding in the lung apices. IMPRESSION: 1. No acute intracranial process. 2. No acute fracture or traumatic malalignment in the cervical spine. 3. 1.5 cm hypodense nodule in the left thyroid. If this has not previously been evaluated, a non-emergent ultrasound of the thyroid is recommended. (Reference: J Am Coll Radiol. 2015 Feb;12(2): 143-50) 4. Degenerative changes in the cervical spine, worst at C5-C6, where there is moderate spinal canal stenosis and moderate left neural foraminal narrowing. Electronically Signed   By: Wiliam Ke M.D.   On: 11/22/2022 13:18   DG Shoulder Left  Result Date: 11/22/2022 CLINICAL DATA:  Fall at home, left anterior shoulder pain EXAM: LEFT SHOULDER - 3 VIEW COMPARISON:  No prior shoulder radiograph, correlation is made with chest radiograph 11/10/2020 FINDINGS: No evidence of acute fracture. Widening of the acromioclavicular joint, and increased coracoclavicular distance, measuring approximately 34 mm, with superior displacement of the distal clavicle, which appears new compared to the 11/10/2021 chest radiograph. The glenohumeral joint is unremarkable. No acute finding in the imaged lung. No displaced rib fracture is seen. IMPRESSION: Acromioclavicular joint injury, with elevation of the clavicle and markedly increased coracoclavicular distance, consistent with [place] type V injury. Electronically Signed   By: Wiliam Ke M.D.   On: 11/22/2022 12:07    Procedures Procedures    Medications Ordered in ED Medications  acetaminophen (TYLENOL) tablet 650 mg (650 mg Oral Given 11/22/22 1325)    ED Course/ Medical Decision Making/ A&P                             Medical Decision Making Amount and/or Complexity of Data Reviewed Radiology: ordered.  Risk OTC drugs.   Patient with fall.  Left shoulder pain.  Did hit head but initially no symptoms.  Shoulder x-ray done and showed rather severe left AC joint separation.  Will have follow-up with Ortho.   However while in the ER developed headache and worsening neck pain.  Thought to be secondary to the fall.  Head CT done and reassuring.  Cervical spine CT does show some chronic disease being followed as an outpatient.  Also had thyroid nodule that can be followed as an outpatient.  Informed patient.  Discharge home.        Final Clinical Impression(s) / ED Diagnoses Final diagnoses:  AC separation, left, initial encounter  Thyroid nodule    Rx / DC Orders ED Discharge Orders     None         Benjiman Core, MD 11/22/22 1541

## 2022-11-22 NOTE — Discharge Instructions (Addendum)
There is also a nodule in your thyroid.  Follow-up with your primary care doctor for it.  Also there is some narrowing in your cervical spine that can follow-up with your primary care for.

## 2022-11-22 NOTE — ED Notes (Signed)
Pt transported to CT ?

## 2022-12-02 ENCOUNTER — Ambulatory Visit: Payer: Medicaid Other | Admitting: Cardiology

## 2023-01-09 ENCOUNTER — Encounter: Payer: Self-pay | Admitting: Family Medicine

## 2023-01-20 ENCOUNTER — Other Ambulatory Visit: Payer: Self-pay

## 2023-01-20 ENCOUNTER — Encounter (HOSPITAL_BASED_OUTPATIENT_CLINIC_OR_DEPARTMENT_OTHER): Payer: Self-pay | Admitting: Orthopedic Surgery

## 2023-01-23 ENCOUNTER — Encounter (HOSPITAL_BASED_OUTPATIENT_CLINIC_OR_DEPARTMENT_OTHER)
Admission: RE | Admit: 2023-01-23 | Discharge: 2023-01-23 | Disposition: A | Discharge status: Home / Self Care | Payer: Medicaid Other | Source: Ambulatory Visit | Attending: Orthopedic Surgery | Admitting: Orthopedic Surgery

## 2023-01-23 DIAGNOSIS — Z01818 Encounter for other preprocedural examination: Secondary | ICD-10-CM | POA: Diagnosis present

## 2023-01-23 DIAGNOSIS — R9431 Abnormal electrocardiogram [ECG] [EKG]: Secondary | ICD-10-CM | POA: Insufficient documentation

## 2023-01-23 LAB — BASIC METABOLIC PANEL
Anion gap: 16 — ABNORMAL HIGH (ref 5–15)
BUN: 13 mg/dL (ref 6–20)
CO2: 26 mmol/L (ref 22–32)
Calcium: 9.1 mg/dL (ref 8.9–10.3)
Chloride: 100 mmol/L (ref 98–111)
Creatinine, Ser: 0.9 mg/dL (ref 0.44–1.00)
GFR, Estimated: >60 mL/min (ref >60)
Glucose, Bld: 156 mg/dL — ABNORMAL HIGH (ref 70–99)
Potassium: 3.5 mmol/L (ref 3.5–5.1)
Sodium: 142 mmol/L (ref 135–145)

## 2023-01-23 NOTE — Progress Notes (Signed)

## 2023-01-27 ENCOUNTER — Encounter (HOSPITAL_BASED_OUTPATIENT_CLINIC_OR_DEPARTMENT_OTHER)
Admission: RE | Disposition: A | Discharge status: Home / Self Care | Payer: Self-pay | Source: Home / Self Care | Attending: Orthopedic Surgery

## 2023-01-27 ENCOUNTER — Ambulatory Visit (HOSPITAL_BASED_OUTPATIENT_CLINIC_OR_DEPARTMENT_OTHER)
Admission: RE | Admit: 2023-01-27 | Discharge: 2023-01-27 | Disposition: A | Discharge status: Home / Self Care | Payer: Medicaid Other | Attending: Orthopedic Surgery | Admitting: Orthopedic Surgery

## 2023-01-27 ENCOUNTER — Ambulatory Visit (HOSPITAL_BASED_OUTPATIENT_CLINIC_OR_DEPARTMENT_OTHER): Payer: Medicaid Other | Admitting: Anesthesiology

## 2023-01-27 ENCOUNTER — Other Ambulatory Visit: Payer: Self-pay

## 2023-01-27 ENCOUNTER — Encounter (HOSPITAL_BASED_OUTPATIENT_CLINIC_OR_DEPARTMENT_OTHER): Payer: Self-pay | Admitting: Orthopedic Surgery

## 2023-01-27 DIAGNOSIS — E785 Hyperlipidemia, unspecified: Secondary | ICD-10-CM | POA: Insufficient documentation

## 2023-01-27 DIAGNOSIS — S43102A Unspecified dislocation of left acromioclavicular joint, initial encounter: Secondary | ICD-10-CM

## 2023-01-27 DIAGNOSIS — Z01818 Encounter for other preprocedural examination: Secondary | ICD-10-CM

## 2023-01-27 DIAGNOSIS — F419 Anxiety disorder, unspecified: Secondary | ICD-10-CM | POA: Insufficient documentation

## 2023-01-27 DIAGNOSIS — F17210 Nicotine dependence, cigarettes, uncomplicated: Secondary | ICD-10-CM | POA: Insufficient documentation

## 2023-01-27 DIAGNOSIS — Z79899 Other long term (current) drug therapy: Secondary | ICD-10-CM | POA: Insufficient documentation

## 2023-01-27 DIAGNOSIS — I10 Essential (primary) hypertension: Secondary | ICD-10-CM | POA: Diagnosis not present

## 2023-01-27 DIAGNOSIS — S43432A Superior glenoid labrum lesion of left shoulder, initial encounter: Secondary | ICD-10-CM | POA: Insufficient documentation

## 2023-01-27 DIAGNOSIS — X58XXXA Exposure to other specified factors, initial encounter: Secondary | ICD-10-CM | POA: Diagnosis not present

## 2023-01-27 DIAGNOSIS — M1990 Unspecified osteoarthritis, unspecified site: Secondary | ICD-10-CM | POA: Diagnosis not present

## 2023-01-27 DIAGNOSIS — E119 Type 2 diabetes mellitus without complications: Secondary | ICD-10-CM | POA: Insufficient documentation

## 2023-01-27 DIAGNOSIS — Z9151 Personal history of suicidal behavior: Secondary | ICD-10-CM | POA: Insufficient documentation

## 2023-01-27 DIAGNOSIS — F319 Bipolar disorder, unspecified: Secondary | ICD-10-CM | POA: Insufficient documentation

## 2023-01-27 DIAGNOSIS — Z7984 Long term (current) use of oral hypoglycemic drugs: Secondary | ICD-10-CM | POA: Insufficient documentation

## 2023-01-27 HISTORY — PX: SHOULDER ARTHROSCOPY: SHX128

## 2023-01-27 HISTORY — DX: Type 2 diabetes mellitus without complications: E11.9

## 2023-01-27 HISTORY — DX: Essential (primary) hypertension: I10

## 2023-01-27 HISTORY — PX: RECONSTRUCTION OF CORACOCLAVICULAR LIGAMENT: SHX6045

## 2023-01-27 LAB — GLUCOSE, CAPILLARY
Glucose-Capillary: 134 mg/dL — ABNORMAL HIGH (ref 70–99)
Glucose-Capillary: 118 mg/dL — ABNORMAL HIGH (ref 70–99)

## 2023-01-27 SURGERY — RECONSTRUCTION, LIGAMENT, CORACOCLAVICULAR
Anesthesia: Regional | Site: Shoulder | Laterality: Left

## 2023-01-27 MED ORDER — LIDOCAINE 2% (20 MG/ML) 5 ML SYRINGE
INTRAMUSCULAR | Status: AC
  Filled 2023-01-27: qty 5

## 2023-01-27 MED ORDER — FENTANYL CITRATE (PF) 100 MCG/2ML IJ SOLN
50.0000 ug | Freq: Once | INTRAMUSCULAR | Status: AC
  Administered 2023-01-27: 50 ug via INTRAVENOUS

## 2023-01-27 MED ORDER — FENTANYL CITRATE (PF) 100 MCG/2ML IJ SOLN
INTRAMUSCULAR | Status: DC | PRN
  Administered 2023-01-27: 100 ug via INTRAVENOUS

## 2023-01-27 MED ORDER — FENTANYL CITRATE (PF) 100 MCG/2ML IJ SOLN
INTRAMUSCULAR | Status: AC
  Filled 2023-01-27: qty 2

## 2023-01-27 MED ORDER — SUGAMMADEX SODIUM 200 MG/2ML IV SOLN
INTRAVENOUS | Status: DC | PRN
  Administered 2023-01-27: 200 mg via INTRAVENOUS

## 2023-01-27 MED ORDER — ROCURONIUM BROMIDE 100 MG/10ML IV SOLN
INTRAVENOUS | Status: DC | PRN
  Administered 2023-01-27: 50 mg via INTRAVENOUS

## 2023-01-27 MED ORDER — EPHEDRINE 5 MG/ML INJ
INTRAVENOUS | Status: AC
  Filled 2023-01-27: qty 5

## 2023-01-27 MED ORDER — DEXAMETHASONE SODIUM PHOSPHATE 10 MG/ML IJ SOLN
INTRAMUSCULAR | Status: DC | PRN
  Administered 2023-01-27: 4 mg via INTRAVENOUS

## 2023-01-27 MED ORDER — BUPIVACAINE HCL (PF) 0.5 % IJ SOLN
INTRAMUSCULAR | Status: DC | PRN
  Administered 2023-01-27: 15 mL via PERINEURAL

## 2023-01-27 MED ORDER — CEFAZOLIN SODIUM-DEXTROSE 2-4 GM/100ML-% IV SOLN
INTRAVENOUS | Status: AC
  Filled 2023-01-27: qty 100

## 2023-01-27 MED ORDER — PHENYLEPHRINE HCL (PRESSORS) 10 MG/ML IV SOLN
INTRAVENOUS | Status: AC
  Filled 2023-01-27: qty 1

## 2023-01-27 MED ORDER — LIDOCAINE 2% (20 MG/ML) 5 ML SYRINGE
INTRAMUSCULAR | Status: DC | PRN
  Administered 2023-01-27: 40 mg via INTRAVENOUS

## 2023-01-27 MED ORDER — MIDAZOLAM HCL 2 MG/2ML IJ SOLN
INTRAMUSCULAR | Status: AC
  Filled 2023-01-27: qty 2

## 2023-01-27 MED ORDER — CEFAZOLIN SODIUM-DEXTROSE 2-4 GM/100ML-% IV SOLN
2.0000 g | INTRAVENOUS | Status: AC
  Administered 2023-01-27: 2 g via INTRAVENOUS

## 2023-01-27 MED ORDER — ONDANSETRON HCL 4 MG/2ML IJ SOLN: 4.0000 mg | Freq: Four times a day (QID) | INTRAMUSCULAR | Status: DC | PRN

## 2023-01-27 MED ORDER — MIDAZOLAM HCL 2 MG/2ML IJ SOLN
1.0000 mg | Freq: Once | INTRAMUSCULAR | Status: AC
  Administered 2023-01-27: 1 mg via INTRAVENOUS

## 2023-01-27 MED ORDER — BUPIVACAINE LIPOSOME 1.3 % IJ SUSP
INTRAMUSCULAR | Status: DC | PRN
  Administered 2023-01-27: 10 mL via PERINEURAL

## 2023-01-27 MED ORDER — ROCURONIUM BROMIDE 10 MG/ML (PF) SYRINGE
PREFILLED_SYRINGE | INTRAVENOUS | Status: AC
  Filled 2023-01-27: qty 10

## 2023-01-27 MED ORDER — DEXAMETHASONE SODIUM PHOSPHATE 10 MG/ML IJ SOLN
INTRAMUSCULAR | Status: AC
  Filled 2023-01-27: qty 1

## 2023-01-27 MED ORDER — LACTATED RINGERS IV SOLN: INTRAVENOUS | Status: DC

## 2023-01-27 MED ORDER — PHENYLEPHRINE HCL-NACL 20-0.9 MG/250ML-% IV SOLN
INTRAVENOUS | Status: DC | PRN
  Administered 2023-01-27: 25 ug/min via INTRAVENOUS

## 2023-01-27 MED ORDER — OXYCODONE HCL 5 MG PO TABS: 5.0000 mg | ORAL_TABLET | ORAL | 0 refills | Status: DC | PRN

## 2023-01-27 MED ORDER — ONDANSETRON HCL 4 MG/2ML IJ SOLN
INTRAMUSCULAR | Status: AC
  Filled 2023-01-27: qty 2

## 2023-01-27 MED ORDER — FENTANYL CITRATE (PF) 100 MCG/2ML IJ SOLN: 25.0000 ug | INTRAMUSCULAR | Status: DC | PRN

## 2023-01-27 MED ORDER — PROPOFOL 10 MG/ML IV BOLUS
INTRAVENOUS | Status: DC | PRN
  Administered 2023-01-27: 200 mg via INTRAVENOUS

## 2023-01-27 MED ORDER — SODIUM CHLORIDE 0.9 % IR SOLN
Status: DC | PRN
  Administered 2023-01-27: 3000 mL

## 2023-01-27 MED ORDER — EPHEDRINE SULFATE (PRESSORS) 50 MG/ML IJ SOLN
INTRAMUSCULAR | Status: DC | PRN
  Administered 2023-01-27: 5 mg via INTRAVENOUS

## 2023-01-27 MED ORDER — ONDANSETRON HCL 4 MG PO TABS: 4.0000 mg | ORAL_TABLET | Freq: Three times a day (TID) | ORAL | 0 refills | Status: DC | PRN

## 2023-01-27 MED ORDER — PROPOFOL 10 MG/ML IV BOLUS
INTRAVENOUS | Status: AC
  Filled 2023-01-27: qty 20

## 2023-01-27 MED ORDER — 0.9 % SODIUM CHLORIDE (POUR BTL) OPTIME
TOPICAL | Status: DC | PRN
  Administered 2023-01-27: 1000 mL

## 2023-01-27 SURGICAL SUPPLY — 73 items
AID PSTN UNV HD RSTRNT DISP (MISCELLANEOUS) ×1
APL PRP STRL LF DISP 70% ISPRP (MISCELLANEOUS)
BLADE AVERAGE 25X9 (BLADE) IMPLANT
BLADE EXCALIBUR 4.0X13 (MISCELLANEOUS) ×1 IMPLANT
BLADE SURG 10 STRL SS (BLADE) ×1 IMPLANT
BLADE SURG 15 STRL LF DISP TIS (BLADE) ×1 IMPLANT
BLADE SURG 15 STRL SS (BLADE) ×1
BNDG CMPR 5X4 CHSV STRCH STRL (GAUZE/BANDAGES/DRESSINGS)
BNDG COHESIVE 4X5 TAN STRL LF (GAUZE/BANDAGES/DRESSINGS) IMPLANT
BURR OVAL 8 FLU 4.0X13 (MISCELLANEOUS) IMPLANT
CANNULA PASSPORT BUTTON 10-40 (CANNULA) ×1 IMPLANT
CANNULA TWIST IN 8.25X7CM (CANNULA) IMPLANT
CHLORAPREP W/TINT 26 (MISCELLANEOUS) ×1 IMPLANT
CLSR STERI-STRIP ANTIMIC 1/2X4 (GAUZE/BANDAGES/DRESSINGS) ×1 IMPLANT
COOLER ICEMAN CLASSIC (MISCELLANEOUS) ×1 IMPLANT
DRAPE C-ARM 42X72 X-RAY (DRAPES) IMPLANT
DRAPE INCISE IOBAN 66X45 STRL (DRAPES) IMPLANT
DRAPE OEC MINIVIEW 54X84 (DRAPES) IMPLANT
DRAPE SHOULDER BEACH CHAIR (DRAPES) ×2 IMPLANT
DRAPE U-SHAPE 76X120 STRL (DRAPES) ×2 IMPLANT
DRSG AQUACEL AG ADV 3.5X 6 (GAUZE/BANDAGES/DRESSINGS) IMPLANT
DURAPREP 26ML APPLICATOR (WOUND CARE) IMPLANT
ELECT REM PT RETURN 9FT ADLT (ELECTROSURGICAL) ×1
ELECTRODE REM PT RTRN 9FT ADLT (ELECTROSURGICAL) ×1 IMPLANT
FIBER TAPE 2MM (SUTURE) ×2 IMPLANT
FIBERSTICK 2 (SUTURE) ×2 IMPLANT
FIBERTAPE CERCLAGE TLINK SUT (SUTURE) IMPLANT
GAUZE PAD ABD 8X10 STRL (GAUZE/BANDAGES/DRESSINGS) ×1 IMPLANT
GAUZE SPONGE 4X4 12PLY STRL (GAUZE/BANDAGES/DRESSINGS) ×1 IMPLANT
GAUZE XEROFORM 1X8 LF (GAUZE/BANDAGES/DRESSINGS) IMPLANT
GLOVE BIO SURGEON STRL SZ7.5 (GLOVE) ×2 IMPLANT
GLOVE BIOGEL PI IND STRL 8 (GLOVE) ×2 IMPLANT
GOWN STRL REUS W/ TWL LRG LVL3 (GOWN DISPOSABLE) ×1 IMPLANT
GOWN STRL REUS W/TWL LRG LVL3 (GOWN DISPOSABLE) ×1
GOWN STRL REUS W/TWL XL LVL3 (GOWN DISPOSABLE) ×2 IMPLANT
GRAFT TISS SEMITEND 4-8 (Bone Implant) IMPLANT
LASSO 90 CVE QUICKPAS (DISPOSABLE) IMPLANT
PACK ARTHROSCOPY DSU (CUSTOM PROCEDURE TRAY) ×1 IMPLANT
PACK BASIN DAY SURGERY FS (CUSTOM PROCEDURE TRAY) ×1 IMPLANT
PAD COLD SHLDR WRAP-ON (PAD) ×1 IMPLANT
PAD ORTHO SHOULDER 7X19 LRG (SOFTGOODS) IMPLANT
PENCIL SMOKE EVACUATOR (MISCELLANEOUS) ×1 IMPLANT
PORT APPOLLO RF 90DEGREE MULTI (SURGICAL WAND) ×1 IMPLANT
RESTRAINT HEAD UNIVERSAL NS (MISCELLANEOUS) ×1 IMPLANT
SHEET MEDIUM DRAPE 40X70 STRL (DRAPES) ×1 IMPLANT
SLEEVE SCD COMPRESS KNEE MED (STOCKING) ×1 IMPLANT
SLING ARM FOAM STRAP LRG (SOFTGOODS) IMPLANT
SPIKE FLUID TRANSFER (MISCELLANEOUS) IMPLANT
SPONGE T-LAP 18X18 ~~LOC~~+RFID (SPONGE) ×1 IMPLANT
SUT 2 FIBERLOOP 20 STRT BLUE (SUTURE) ×2
SUT ETHILON 3 0 PS 1 (SUTURE) IMPLANT
SUT FIBERWIRE #2 38 T-5 BLUE (SUTURE) ×1
SUT FIBERWIRE 2-0 18 17.9 3/8 (SUTURE)
SUT MNCRL AB 4-0 PS2 18 (SUTURE) ×1 IMPLANT
SUT MON AB 3-0 SH 27 (SUTURE) ×1
SUT MON AB 3-0 SH27 (SUTURE) IMPLANT
SUT TIGER TAPE 7 IN WHITE (SUTURE) IMPLANT
SUT VIC AB 0 CT1 27 (SUTURE) ×1
SUT VIC AB 0 CT1 27XBRD ANBCTR (SUTURE) ×1 IMPLANT
SUT VIC AB 2-0 CT1 27 (SUTURE) ×1
SUT VIC AB 2-0 CT1 TAPERPNT 27 (SUTURE) IMPLANT
SUT VIC AB 3-0 SH 27 (SUTURE) ×1
SUT VIC AB 3-0 SH 27X BRD (SUTURE) ×1 IMPLANT
SUTURE 2 FIBERLOOP 20 STRT BLU (SUTURE) ×2 IMPLANT
SUTURE FIBERWR #2 38 T-5 BLUE (SUTURE) IMPLANT
SUTURE FIBERWR 2-0 18 17.9 3/8 (SUTURE) IMPLANT
TAPE FIBER 2MM 7IN #2 BLUE (SUTURE) IMPLANT
TENDON SEMI-TENDINOSUS (Bone Implant) ×1 IMPLANT
TENSIONER FIBERTAPE CERCLAGE (DISPOSABLE) IMPLANT
TOWEL GREEN STERILE FF (TOWEL DISPOSABLE) ×1 IMPLANT
TUBE CONNECTING 20X1/4 (TUBING) IMPLANT
TUBING ARTHROSCOPY IRRIG 16FT (MISCELLANEOUS) ×1 IMPLANT
YANKAUER SUCT BULB TIP NO VENT (SUCTIONS) IMPLANT

## 2023-01-27 NOTE — Anesthesia Procedure Notes (Signed)
Anesthesia Regional Block: Interscalene brachial plexus block   Pre-Anesthetic Checklist: , timeout performed,  Correct Patient, Correct Site, Correct Laterality,  Correct Procedure, Correct Position, site marked,  Risks and benefits discussed,  Surgical consent,  Pre-op evaluation,  At surgeon's request and post-op pain management  Laterality: Left  Prep: chloraprep       Needles:  Injection technique: Single-shot  Needle Type: Echogenic Stimulator Needle     Needle Length: 5cm  Needle Gauge: 22     Additional Needles:   Procedures:, nerve stimulator,,,,,     Nerve Stimulator or Paresthesia:  Response: biceps flexion, 0.45 mA  Additional Responses:   Narrative:  Start time: 01/27/2023 11:12 AM End time: 01/27/2023 11:19 AM Injection made incrementally with aspirations every 5 mL.  Performed by: Personally  Anesthesiologist: Achille Rich, MD  Additional Notes: Functioning IV was confirmed and monitors were applied.  A 50mm 22ga Arrow echogenic stimulator needle was used. Sterile prep and drape,hand hygiene and sterile gloves were used.  Negative aspiration and negative test dose prior to incremental administration of local anesthetic. The patient tolerated the procedure well.  Ultrasound guidance: relevent anatomy identified, needle position confirmed, local anesthetic spread visualized around nerve(s), vascular puncture avoided.  Image printed for medical record.

## 2023-01-27 NOTE — Anesthesia Procedure Notes (Signed)
Procedure Name: Intubation Date/Time: 01/27/2023 12:08 PM  Performed by: Burna Cash, CRNAPre-anesthesia Checklist: Patient identified, Emergency Drugs available, Suction available and Patient being monitored Patient Re-evaluated:Patient Re-evaluated prior to induction Oxygen Delivery Method: Circle system utilized Preoxygenation: Pre-oxygenation with 100% oxygen Induction Type: IV induction Ventilation: Mask ventilation without difficulty and Oral airway inserted - appropriate to patient size Laryngoscope Size: Mac and 3 Grade View: Grade III Tube type: Oral Tube size: 7.0 mm Number of attempts: 1 Airway Equipment and Method: Stylet, Bite block and Oral airway Placement Confirmation: ETT inserted through vocal cords under direct vision, positive ETCO2 and breath sounds checked- equal and bilateral Tube secured with: Tape Dental Injury: Teeth and Oropharynx as per pre-operative assessment  Comments: DL x 1 with Mac 4 by CRNA with grade 4 view; DL x1 with Mac 3 by MDA, grade 3 view

## 2023-01-27 NOTE — Anesthesia Preprocedure Evaluation (Signed)
Anesthesia Evaluation  Patient identified by MRN, date of birth, ID band Patient awake    Reviewed: Allergy & Precautions, H&P , NPO status , Patient's Chart, lab work & pertinent test results  Airway Mallampati: II   Neck ROM: full    Dental   Pulmonary Current Smoker   breath sounds clear to auscultation       Cardiovascular hypertension,  Rhythm:regular Rate:Normal     Neuro/Psych  PSYCHIATRIC DISORDERS Anxiety  Bipolar Disorder      GI/Hepatic   Endo/Other  diabetes, Type 2    Renal/GU stones     Musculoskeletal  (+) Arthritis ,    Abdominal   Peds  Hematology   Anesthesia Other Findings   Reproductive/Obstetrics                             Anesthesia Physical Anesthesia Plan  ASA: 2  Anesthesia Plan: General   Post-op Pain Management: Regional block*   Induction: Intravenous  PONV Risk Score and Plan: 2 and Ondansetron, Dexamethasone, Midazolam and Treatment may vary due to age or medical condition  Airway Management Planned: Oral ETT  Additional Equipment:   Intra-op Plan:   Post-operative Plan: Extubation in OR  Informed Consent: I have reviewed the patients History and Physical, chart, labs and discussed the procedure including the risks, benefits and alternatives for the proposed anesthesia with the patient or authorized representative who has indicated his/her understanding and acceptance.     Dental advisory given  Plan Discussed with: CRNA, Anesthesiologist and Surgeon  Anesthesia Plan Comments:        Anesthesia Quick Evaluation

## 2023-01-27 NOTE — Anesthesia Postprocedure Evaluation (Signed)
Anesthesia Post Note  Patient: Lafaye R Pangelinan  Procedure(s) Performed: RECONSTRUCTION OF ACROMIOCLAVICULAR WITH HAMSTRING ALLOGRAFT SEMI-T (Left: Shoulder) ARTHROSCOPY SHOULDER (Left: Shoulder)     Patient location during evaluation: PACU Anesthesia Type: Regional and General Level of consciousness: awake and alert Pain management: pain level controlled Vital Signs Assessment: post-procedure vital signs reviewed and stable Respiratory status: spontaneous breathing, nonlabored ventilation, respiratory function stable and patient connected to nasal cannula oxygen Cardiovascular status: blood pressure returned to baseline and stable Postop Assessment: no apparent nausea or vomiting Anesthetic complications: no   No notable events documented.  Last Vitals:  Vitals:   01/27/23 1415 01/27/23 1430  BP: (!) 111/59 117/64  Pulse:    Resp:  18  Temp:  36.9 C  SpO2:  99%    Last Pain:  Vitals:   01/27/23 1430  TempSrc:   PainSc: 0-No pain                 Linh Hedberg S

## 2023-01-27 NOTE — Progress Notes (Signed)
Assisted Dr. Hodierne with left, interscalene , ultrasound guided block. Side rails up, monitors on throughout procedure. See vital signs in flow sheet. Tolerated Procedure well. 

## 2023-01-27 NOTE — Discharge Instructions (Addendum)
Orthopedic surgery discharge instructions:  -Maintain postoperative bandages for 3 days.  You may remove these bandages on post op day 3 and begin showering at that time.  Please do not submerge underwater.  -Maintain your arm in sling at all times.  You should only remove for showering and getting dressed.  No lifting with the operative arm.  -For mild to moderate pain use Tylenol and Advil in alternating fashion around-the-clock.  For breakthrough pain use oxycodone as necessary.  -Please apply ice to the right shoulder for 20-30 minutes out of each hour that you are awake.  Do this around-the-clock for the first 3 days from surgery.  -Follow-up in 2 weeks for routine postoperative check.   Post Anesthesia Home Care Instructions  Activity: Get plenty of rest for the remainder of the day. A responsible individual must stay with you for 24 hours following the procedure.  For the next 24 hours, DO NOT: -Drive a car -Advertising copywriter -Drink alcoholic beverages -Take any medication unless instructed by your physician -Make any legal decisions or sign important papers.  Meals: Start with liquid foods such as gelatin or soup. Progress to regular foods as tolerated. Avoid greasy, spicy, heavy foods. If nausea and/or vomiting occur, drink only clear liquids until the nausea and/or vomiting subsides. Call your physician if vomiting continues.  Special Instructions/Symptoms: Your throat may feel dry or sore from the anesthesia or the breathing tube placed in your throat during surgery. If this causes discomfort, gargle with warm salt water. The discomfort should disappear within 24 hours.  If you had a scopolamine patch placed behind your ear for the management of post- operative nausea and/or vomiting:  1. The medication in the patch is effective for 72 hours, after which it should be removed.  Wrap patch in a tissue and discard in the trash. Wash hands thoroughly with soap and water. 2.  You may remove the patch earlier than 72 hours if you experience unpleasant side effects which may include dry mouth, dizziness or visual disturbances. 3. Avoid touching the patch. Wash your hands with soap and water after contact with the patch.

## 2023-01-27 NOTE — Op Note (Signed)
Date of Surgery: 01/27/2023  INDICATIONS: [name] is a 62 y.o.-year-old female with a left grade 5 acromioclavicular joint separation from an acute injury back in April.  Here today for surgical management.  She did have a period of nonoperative treatment and was continuing with significant pain and dysfunction.  Here today for the definitive fix.  We discussed arthroscopic assisted AC joint reduction with CC ligament reconstruction utilizing allograft.;  The patient did consent to the procedure after discussion of the risks and benefits.  PREOPERATIVE DIAGNOSIS:  1.  Left type V acromioclavicular joint separation  POSTOPERATIVE DIAGNOSIS:  1.  Left type V acromioclavicular joint separation 2.  Left shoulder degenerative labral tearing anterior, superior.  PROCEDURE: \ 1.  Left shoulder arthroscopic extensive debridement of rotator interval as well as glenoid labrum 2.  Left shoulder arthroscopic assisted coracoacromial ligament reconstruction with hamstring allograft.  SURGEON: Maryan Rued, M.D.  ASSIST: Dion Saucier, PA-C  Assistant attestation:  PA McClung present for the entire procedure.  He participated in all critical portions..  ANESTHESIA:  general, with interscalene block  IV FLUIDS AND URINE: See anesthesia.  ESTIMATED BLOOD LOSS: 20 mL.  IMPLANTS: Arthrex cerclage tape with tensioner Cement tendinosis hamstring allograft  7 mm x 220 mm  DRAINS: None  COMPLICATIONS: None.  DESCRIPTION OF PROCEDURE: The patient was brought to the operating room and placed supine on the operating table.  The patient had been signed prior to the procedure and this was documented. The patient had the anesthesia placed by the anesthesiologist.  A time-out was performed to confirm that this was the correct patient, site, side and location.  Patient was then set up into a beachchair position with a well secured head holding device that was patent.  The patient did receive antibiotics prior  to the incision and was re-dosed during the procedure as needed at indicated intervals.  A tourniquet was not placed.  The patient had the operative extremity prepped and draped in the standard surgical fashion.     We began the procedure by performing our diagnostic arthroscopy.  We established a posterior lateral viewing portal 2 cm inferior 1 cm medial to the posterior lateral border of the acromion.  On diagnostic arthroscopy we identified degenerative labral tearing of the joint anterior, superior posterior.  This was associated with some chondrocalcinosis as well within the substance of the labrum.  No glenohumeral arthritis.  No loose bodies noted.  There was moderate synovitis in inflammation of the joint.  The Fidel head of biceps tendon was inflamed but not torn.  Supraspinatus, infraspinatus, teres minor intact.  Subscapularis likewise intact with no hidden lesion no tears.  There was significant inflammatory tissue capsulitis of the rotator interval.  Following diagnostic arthroscopy we established a anterior working portal.  We did this through the rotator interval via spinal needle localization.  We then performed extensive debridement of the joint utilizing motorized shaver to debride inflammatory tissue from the rotator interval and wide excision including releasing the middle glenohumeral ligament.  We then utilized motorized shaver to gently debride nonviable tissue of the labrum anterior, superior, and posterior.  This completed the extensive debridement.  We next turned our attention to the arthroscopic assisted cc ligament reconstruction.  While viewing from the posterior portal we then changed to a 70 degree scope to better identify the coracoid process.  We skeletonized the coracoid process while working through the mid glenoid portal.  To better establish the ability to reach the medial aspect  of the coracoid to release the pectoralis minor fascia we did utilize an accessory low anterior  portal via spinal needle localization.  We placed a cannula through that such that we can manage sutures.  We then continued to skeletonize the coracoid process.  Once we were able to work our way all the way over to the base of the coracoid at the scapular neck we then gently decorticated the undersurface to prepare for graft placement.  We then utilized a switching sticks to establish our correct position for the graft reconstruction.  We first identified the medialmost aspect of the coracoid via spinal needle localization.  This helped [place] to establish a saber incision along the distal clavicle.  Once we were satisfied with that location we then used electrocautery to cut down onto the clavicle and a vertical fashion.  We then split the clavicular periosteum and capsule longitudinally.  Then we utilized a key elevator to access the anterior posterior most extent of the clavicle at the location of the coracoclavicular ligaments.  We then utilized a switching stick to establish a soft tissue passageway for our graft and cerclage sutures.  We first reproduced the location of the conoid ligament posterior to the clavicle and medial to the coracoid.  We then dilated through the dilating cannula passed this suture to control that passageway.  We did the same to reproduce the trapezoid ligament anterior to the clavicle and lateral to the coracoid process.  Once we had that pathway established with our passing sutures we then passed first our cerclage suture.  We wrapped this around the undersurface of the coracoid and then brought this over the top of the clavicle where we reduced the arm while providing upward force along the distal humerus and downward traction/forced on the distal clavicle to reduce the [facility] joint.  While in that position we used the cerclage tensioner from Arthrex to provide reduction force through the suture tapes.  We then tied these tapes to secure the reduction.  Next we passed our semitendinosus  allograft along the same plane.  We delivered this to the superior surface of the clavicle and tied this to the superior surface of the clavicle and secured this with two #2 FiberWire figure-of-eight sutures.  We are satisfied with the reduction and then once again went back in retention with cerclage tape and finished that with alternating half inch throws.  There were satisfied with the closure of the [facility] joint.  We then began closing the wound and layers.  We use a lot utilized a 0 Vicryl for the clavicular fascia, also 0 Vicryl for the deep subcutaneous fat, and then 2-0 Vicryl for deep dermal layer and 3-0 Monocryl for skin.  This concluded the portal sites.  The arm was cleaned and dried 1 final time in standard Steri-Strips sterile bandage was applied.  The arm was then placed in a sling with abduction pillow and immobilizer strap.  Patient was awakened from general anesthetic in stable condition.  She was transported to the stretcher and then to PACU in stable condition. Marland Kitchen  POSTOPERATIVE PLAN: Ms. Plotner will be in her sling for 6 weeks.  Should be nonweightbearing to the left upper extremity for 6 weeks as well.  She will take care to assure that the sling is well-fitting to support the weight of the arm to prevent any undue tension on the repair site.  I will see her back in the office in 2 weeks for routine wound check.  She was discharged home today from PACU.

## 2023-01-27 NOTE — Transfer of Care (Signed)
Immediate Anesthesia Transfer of Care Note  Patient: [name]  Procedure(s) Performed: RECONSTRUCTION OF ACROMIOCLAVICULAR WITH HAMSTRING ALLOGRAFT SEMI-T (Left: Shoulder) ARTHROSCOPY SHOULDER (Left: Shoulder)  Patient Location: PACU  Anesthesia Type:GA combined with regional for post-op pain  Level of Consciousness: awake, alert , and oriented  Airway & Oxygen Therapy: Patient Spontanous Breathing and Patient connected to face mask oxygen  Post-op Assessment: Report given to RN and Post -op Vital signs reviewed and stable  Post vital signs: Reviewed and stable  Last Vitals:  Vitals Value Taken Time  BP    Temp    Pulse 84 01/27/23 1356  Resp 13 01/27/23 1357  SpO2 100 % 01/27/23 1356  Vitals shown include unvalidated device data.  Last Pain:  Vitals:   01/27/23 0955  TempSrc: Temporal  PainSc: 0-No pain         Complications: No notable events documented.

## 2023-01-27 NOTE — Brief Op Note (Signed)
01/27/2023  1:31 PM  PATIENT:  [name]  62 y.o. female  PRE-OPERATIVE DIAGNOSIS:  Left shoulder type v acriomoclavicular joint seperation  POST-OPERATIVE DIAGNOSIS:  Left shoulder type v acriomoclavicular joint seperation  PROCEDURE:  Procedure(s) with comments: RECONSTRUCTION OF ACROMIOCLAVICULAR WITH HAMSTRING ALLOGRAFT SEMI-T (Left) - 120 ARTHROSCOPY SHOULDER (Left) - 120  SURGEON:  Surgeon(s) and Role:    * Aundria Rud, Noah Delaine, MD - Primary  PHYSICIAN ASSISTANT:  Dion Saucier, PA-C   ANESTHESIA:   regional and general  EBL:  10 cc  BLOOD ADMINISTERED:none  DRAINS: none   LOCAL MEDICATIONS USED:  NONE  SPECIMEN:  No Specimen  DISPOSITION OF SPECIMEN:  N/A  COUNTS:  YES  TOURNIQUET:  * No tourniquets in log *  DICTATION: .Note written in EPIC  PLAN OF CARE: Discharge to home after PACU  PATIENT DISPOSITION:  PACU - hemodynamically stable.   Delay start of Pharmacological VTE agent (>24hrs) due to surgical blood loss or risk of bleeding: not applicable

## 2023-01-27 NOTE — H&P (Signed)
ORTHOPAEDIC H and P  REQUESTING PHYSICIAN: Yolonda Kida, MD  PCP:  Jim Like, NP  Chief Complaint: Left shoulder [facility] joint separation  HPI: [name] is a 62 y.o. female who complains of left shoulder pain and AC joint deformity following an injury.  Here today for arthroscopic assisted stabilization and CC ligament reconstruction.  No new complaints today.  Past Medical History:  Diagnosis Date   Anxiety disorder    Bipolar 1 disorder (HCC)    w/ hx suicide attempt age 43 and 54   Complication of anesthesia    slow to wake   Diabetes mellitus without complication (HCC)    History of kidney stones    History of melanoma excision    2004-- BACK   Hyperlipidemia    Hypertension    OA (osteoarthritis)    KNEE   Renal disorder    Retained ureteral stent    Urgency of urination    Wears glasses    Past Surgical History:  Procedure Laterality Date   CARPAL TUNNEL RELEASE Right 1996   CESAREAN SECTION  1992   CYSTOSCOPY WITH URETEROSCOPY Left 10/19/2016   Procedure: CYSTOSCOPY WITH LEFT  URETEROSCOPY WITH URETERAL STENT REMOVAL;  Surgeon: Crist Fat, MD;  Location: [facility];  Service: Urology;  Laterality: Left;   LUMBAR MICRODISCECTOMY  1995   L4 -- L5   NEPHROLITHOTOMY Left 09/27/2016   Procedure: NEPHROLITHOTOMY PERCUTANEOUS WITH SURGEON ACCESS;  Surgeon: Crist Fat, MD;  Location: [facility];  Service: Urology;  Laterality: Left;   TOTAL ABDOMINAL HYSTERECTOMY W/ BILATERAL SALPINGOOPHORECTOMY  02/06/2009   TUBAL LIGATION Bilateral 1995   Social History   Socioeconomic History   Marital status: Single    Spouse name: Not on file   Number of children: Not on file   Years of education: Not on file   Highest education level: Not on file  Occupational History   Not on file  Tobacco Use   Smoking status: Every Day    Packs/day: 0.50    Years: 40.00    Additional pack years: 0.00    Total pack years: 20.00    Types:  Cigarettes   Smokeless tobacco: Never  Vaping Use   Vaping Use: Never used  Substance and Sexual Activity   Alcohol use: No   Drug use: No   Sexual activity: Not on file  Other Topics Concern   Not on file  Social History Narrative   Not on file   Social Determinants of Health   Financial Resource Strain: Not on file  Food Insecurity: Not on file  Transportation Needs: Not on file  Physical Activity: Not on file  Stress: Not on file  Social Connections: Not on file   History reviewed. No pertinent family history. Allergies  Allergen Reactions   Oxycodone Itching    percocet   Sulfa Antibiotics Rash   Prior to Admission medications   Medication Sig Start Date End Date Taking? Authorizing Provider  clonazePAM (KLONOPIN) 2 MG tablet Take 2 mg by mouth at bedtime.   Yes [provider]  JARDIANCE 10 MG TABS tablet Take 1 tablet by mouth daily.   Yes [provider]  losartan-hydrochlorothiazide (HYZAAR) 50-12.5 MG tablet Take 1 tablet by mouth daily.   Yes [provider]  lurasidone (LATUDA) 40 MG TABS tablet Take 40 mg by mouth daily with breakfast.   Yes [provider]  metFORMIN (GLUCOPHAGE-XR) 500 MG 24 hr tablet  Take 1 tablet by mouth in the morning and at bedtime. 04/27/22  Yes [provider]  PARoxetine (PAXIL) 40 MG tablet Take 40 mg by mouth every morning.    Yes [provider]  rosuvastatin (CRESTOR) 10 MG tablet  11/04/19  Yes [provider]  ibuprofen (ADVIL,MOTRIN) 200 MG tablet Take 200 mg by mouth every 6 (six) hours as needed.    [provider]   No results found.  Positive ROS: All other systems have been reviewed and were otherwise negative with the exception of those mentioned in the HPI and as above.  Physical Exam: General: Alert, no acute distress Cardiovascular: No pedal edema Respiratory: No cyanosis, no use of accessory musculature GI: No organomegaly, abdomen is soft and  non-tender Skin: No lesions in the area of chief complaint Neurologic: Sensation intact distally Psychiatric: Patient is competent for consent with normal mood and affect Lymphatic: No axillary or cervical lymphadenopathy  MUSCULOSKELETAL: Left shoulder exam demonstrates obvious deformity of the distal clavicle.  Otherwise neurovascular intact with no open wounds.  Assessment: Left AC joint type V separation, closed.  Plan: Plan to proceed today with arthroscopic assisted AC joint reduction and CC ligament reconstruction.  We discussed the risk and benefits of the procedure which include but not limited to bleeding, infection, damage to surrounding nerves and vessels, stiffness, failure of repair, fracture, need for further surgery, risk of anesthesia.  She has provided informed consent.  Dc home today from PACU.    Yolonda Kida, MD Cell [phone]    01/27/2023 11:07 AM

## 2023-01-30 ENCOUNTER — Encounter (HOSPITAL_BASED_OUTPATIENT_CLINIC_OR_DEPARTMENT_OTHER): Payer: Self-pay | Admitting: Orthopedic Surgery

## 2023-06-26 ENCOUNTER — Ambulatory Visit (INDEPENDENT_AMBULATORY_CARE_PROVIDER_SITE_OTHER): Payer: Medicaid Other | Admitting: Family

## 2023-06-26 ENCOUNTER — Encounter: Payer: Self-pay | Admitting: Family

## 2023-06-26 VITALS — BP 114/78 | HR 74 | Temp 98.6°F | Ht 69.0 in | Wt 197.8 lb

## 2023-06-26 DIAGNOSIS — E1165 Type 2 diabetes mellitus with hyperglycemia: Secondary | ICD-10-CM

## 2023-06-26 DIAGNOSIS — E785 Hyperlipidemia, unspecified: Secondary | ICD-10-CM | POA: Diagnosis not present

## 2023-06-26 DIAGNOSIS — Z7689 Persons encountering health services in other specified circumstances: Secondary | ICD-10-CM | POA: Diagnosis not present

## 2023-06-26 DIAGNOSIS — Z23 Encounter for immunization: Secondary | ICD-10-CM | POA: Diagnosis not present

## 2023-06-26 DIAGNOSIS — Z7984 Long term (current) use of oral hypoglycemic drugs: Secondary | ICD-10-CM

## 2023-06-26 DIAGNOSIS — I10 Essential (primary) hypertension: Secondary | ICD-10-CM | POA: Diagnosis not present

## 2023-06-26 NOTE — Progress Notes (Signed)
Patient states no concerns to discuss.   Patient wants Flu vaccine.

## 2023-06-26 NOTE — Addendum Note (Signed)
Addended by: Kieth Brightly on: 06/26/2023 09:02 AM   Modules accepted: Orders

## 2023-06-26 NOTE — Progress Notes (Signed)
Subjective:    [name] - 62 y.o. female MRN [id number]  Date of birth: 23-May-1961  HPI  [name] is to establish care.   Current issues and/or concerns: - Doing well on Losartan-Hydrochlorothiazide no issues/concerns. Home blood pressures at goal. She does not complain of red flag symptoms such as but not limited to chest pain, worst headache of life, nausea/vomiting. Reports intermittent shortness of breath. Reports she was scheduled with Cardiology for the same concern and canceled appointment. I discussed with patient in detail the importance of rescheduling appointment. Patient verbalized understanding/agreement. - Doing well on Metformin and Jardiance, no issues/concerns. She denies red flag symptoms associated with diabetes.  - Doing well on Rosuvastatin, no issues/concerns. - Established with Psychiatry. She denies thoughts of self-harm, suicidal ideations, homicidal ideations. - No further issues/concerns for discussion today.   ROS per HPI    Health Maintenance:  Health Maintenance Due  Topic Date Due   HIV Screening  Never done   Diabetic kidney evaluation - Urine ACR  Never done   Hepatitis C Screening  Never done   DTaP/Tdap/Td (1 - Tdap) Never done   Colonoscopy  Never done   Lung Cancer Screening  Never done   Zoster Vaccines- Shingrix (1 of 2) Never done   MAMMOGRAM  02/18/2017   INFLUENZA VACCINE  03/09/2023   COVID-19 Vaccine (2 - 2023-24 season) 04/09/2023     Past Medical History: Patient Active Problem List   Diagnosis Date Noted   Renal stone 09/27/2016      Social History   reports that she has been smoking cigarettes. She has a 20 pack-year smoking history. She has never used smokeless tobacco. She reports that she does not drink alcohol and does not use drugs.   Family History  family history is not on file.   Medications: reviewed and updated   Objective:   Physical Exam BP 114/78   Pulse 74   Temp 98.6 F (37 C) (Oral)   Ht  5\' 9"  (1.753 m)   Wt 197 lb 12.8 oz (89.7 kg)   SpO2 95%   BMI 29.21 kg/m   Physical Exam HENT:     Head: Normocephalic and atraumatic.     Nose: Nose normal.     Mouth/Throat:     Mouth: Mucous membranes are moist.     Pharynx: Oropharynx is clear.  Eyes:     Extraocular Movements: Extraocular movements intact.     Conjunctiva/sclera: Conjunctivae normal.     Pupils: Pupils are equal, round, and reactive to light.  Cardiovascular:     Rate and Rhythm: Normal rate and regular rhythm.     Pulses: Normal pulses.     Heart sounds: Normal heart sounds.  Pulmonary:     Effort: Pulmonary effort is normal.     Breath sounds: Normal breath sounds.  Musculoskeletal:        General: Normal range of motion.     Cervical back: Normal range of motion and neck supple.  Neurological:     General: No focal deficit present.     Mental Status: She is alert and oriented to person, place, and time.  Psychiatric:        Mood and Affect: Mood normal.        Behavior: Behavior normal.        Assessment & Plan:  1. Encounter to establish care - Patient presents today to establish care. During the interim follow-up with primary provider as scheduled.  -  Return for annual physical examination, labs, and health maintenance. Arrive fasting meaning having no food for at least 8 hours prior to appointment. You may have only water or black coffee. Please take scheduled medications as normal.  2. Primary hypertension - Continue Losartan-Hydrochlorothiazide as prescribed. No refills needed as of present.  - Routine screening.  - Counseled on blood pressure goal of less than 130/80, low-sodium, DASH diet, medication compliance, and 150 minutes of moderate intensity exercise per week as tolerated. Counseled on medication adherence and adverse effects. - Keep all scheduled appointments with Cardiology.  - Follow-up with primary provider as scheduled.  - Basic Metabolic Panel  3. Type 2 diabetes  mellitus with hyperglycemia, without Baham-term current use of insulin (HCC) - Continue Metformin and Jardiance as prescribed. No refills needed as of present. Counseled on medication adherence/adverse effects.  - Routine screening.  - Discussed the importance of healthy eating habits, low-carbohydrate diet, low-sugar diet, regular aerobic exercise (at least 150 minutes a week as tolerated) and medication compliance to achieve or maintain control of diabetes. - Follow-up with primary provider as scheduled. - Microalbumin / creatinine urine ratio - Hemoglobin A1c  4. Hyperlipidemia, unspecified hyperlipidemia type - Practice low-fat heart healthy diet and at least 150 minutes of moderate intensity exercise weekly as tolerated.  - Continue Rosuvastatin as prescribed. No refills needed as of present. Counseled on medication adherence/adverse effects. - Routine screening.  - Follow-up with primary provider as scheduled.  - Lipid panel    Patient was given clear instructions to go to Emergency Department or return to medical center if symptoms don't improve, worsen, or new problems develop.The patient verbalized understanding.  I discussed the assessment and treatment plan with the patient. The patient was provided an opportunity to ask questions and all were answered. The patient agreed with the plan and demonstrated an understanding of the instructions.   The patient was advised to call back or seek an in-person evaluation if the symptoms worsen or if the condition fails to improve as anticipated.    Ricky Stabs, NP 06/26/2023, 8:39 AM Primary Care at [facility]

## 2023-06-27 ENCOUNTER — Encounter: Payer: Self-pay | Admitting: Family

## 2023-06-28 ENCOUNTER — Other Ambulatory Visit: Payer: Self-pay | Admitting: Family

## 2023-06-28 DIAGNOSIS — E1165 Type 2 diabetes mellitus with hyperglycemia: Secondary | ICD-10-CM

## 2023-06-28 MED ORDER — DEXCOM G7 SENSOR MISC
1.0000 | Freq: Three times a day (TID) | 2 refills | Status: DC
Start: 2023-06-28 — End: 2023-12-13

## 2023-06-28 MED ORDER — DEXCOM G7 RECEIVER DEVI
1.0000 | Freq: Three times a day (TID) | 2 refills | Status: DC
Start: 2023-06-28 — End: 2023-12-13

## 2023-06-28 NOTE — Telephone Encounter (Signed)
Prescribed per patient request.

## 2023-06-29 LAB — BASIC METABOLIC PANEL
BUN/Creatinine Ratio: 20 (ref 12–28)
BUN: 19 mg/dL (ref 8–27)
CO2: 26 mmol/L (ref 20–29)
Calcium: 9.4 mg/dL (ref 8.7–10.3)
Chloride: 101 mmol/L (ref 96–106)
Creatinine, Ser: 0.97 mg/dL (ref 0.57–1.00)
Glucose: 117 mg/dL — ABNORMAL HIGH (ref 70–99)
Potassium: 3.8 mmol/L (ref 3.5–5.2)
Sodium: 144 mmol/L (ref 134–144)
eGFR: 67 mL/min/{1.73_m2} (ref >59)

## 2023-06-29 LAB — MICROALBUMIN / CREATININE URINE RATIO
Creatinine, Urine: 94 mg/dL
Microalb/Creat Ratio: 15 mg/g{creat} (ref 0–29)
Microalbumin, Urine: 14.3 ug/mL

## 2023-06-29 LAB — LIPID PANEL
Chol/HDL Ratio: 3.4 ratio (ref 0.0–4.4)
Cholesterol, Total: 137 mg/dL (ref 100–199)
HDL: 40 mg/dL (ref >39)
LDL Chol Calc (NIH): 72 mg/dL (ref 0–99)
Triglycerides: 141 mg/dL (ref 0–149)
VLDL Cholesterol Cal: 25 mg/dL (ref 5–40)

## 2023-06-29 LAB — HEMOGLOBIN A1C
Est. average glucose Bld gHb Est-mCnc: 131 mg/dL
Hgb A1c MFr Bld: 6.2 % — ABNORMAL HIGH (ref 4.8–5.6)

## 2023-07-03 ENCOUNTER — Telehealth: Payer: Self-pay

## 2023-07-03 NOTE — Telephone Encounter (Signed)
Can you help me out with this Prior Auth please.  [employer] G7 Receiver

## 2023-07-04 ENCOUNTER — Other Ambulatory Visit: Payer: Self-pay

## 2023-07-20 ENCOUNTER — Ambulatory Visit (INDEPENDENT_AMBULATORY_CARE_PROVIDER_SITE_OTHER): Payer: Medicaid Other | Admitting: Family Medicine

## 2023-07-20 ENCOUNTER — Encounter: Payer: Self-pay | Admitting: Family Medicine

## 2023-07-20 VITALS — BP 119/81 | HR 68 | Temp 98.4°F | Resp 18 | Ht 69.0 in | Wt 196.2 lb

## 2023-07-20 DIAGNOSIS — R051 Acute cough: Secondary | ICD-10-CM | POA: Diagnosis not present

## 2023-07-21 LAB — COVID-19, FLU A+B AND RSV
Influenza A, NAA: NOT DETECTED
Influenza B, NAA: NOT DETECTED
RSV, NAA: NOT DETECTED
SARS-CoV-2, NAA: NOT DETECTED

## 2023-07-24 ENCOUNTER — Encounter: Payer: Self-pay | Admitting: Family Medicine

## 2023-07-24 NOTE — Progress Notes (Signed)
Established Patient Office Visit  Subjective    Patient ID: [name], female    DOB: 07/28/1961  Age: 62 y.o. MRN: [id number]  CC:  Chief Complaint  Patient presents with   dry hacking cough    No sense of taste    HPI [name] presents for complaint of cough. Denies fever/chills. Sx are almost debilitating.denies known contacts with similar sx.   Outpatient Encounter Medications as of 07/20/2023  Medication Sig   ibuprofen (ADVIL,MOTRIN) 200 MG tablet Take 200 mg by mouth every 6 (six) hours as needed.   JARDIANCE 10 MG TABS tablet Take 1 tablet by mouth daily.   losartan-hydrochlorothiazide (HYZAAR) 50-12.5 MG tablet Take 1 tablet by mouth daily.   lurasidone (LATUDA) 40 MG TABS tablet Take 40 mg by mouth daily with breakfast.   metFORMIN (GLUCOPHAGE-XR) 500 MG 24 hr tablet Take 1 tablet by mouth in the morning and at bedtime.   PARoxetine (PAXIL) 40 MG tablet Take 40 mg by mouth every morning.    rosuvastatin (CRESTOR) 10 MG tablet    clonazePAM (KLONOPIN) 2 MG tablet Take 2 mg by mouth at bedtime.   Continuous Glucose Receiver (DEXCOM G7 RECEIVER) DEVI 1 each by Does not apply route 4 (four) times daily -  before meals and at bedtime. (Patient not taking: Reported on 07/20/2023)   Continuous Glucose Sensor (DEXCOM G7 SENSOR) MISC 1 each by Does not apply route 4 (four) times daily -  before meals and at bedtime. (Patient not taking: Reported on 07/20/2023)   oxyCODONE (ROXICODONE) 5 MG immediate release tablet Take 1 tablet (5 mg total) by mouth every 4 (four) hours as needed for severe pain or breakthrough pain.   No facility-administered encounter medications on file as of 07/20/2023.    Past Medical History:  Diagnosis Date   Anxiety disorder    Bipolar 1 disorder (HCC)    w/ hx suicide attempt age 68 and 62   Complication of anesthesia    slow to wake   Diabetes mellitus without complication (HCC)    History of kidney stones    History of melanoma  excision    2004-- BACK   Hyperlipidemia    Hypertension    OA (osteoarthritis)    KNEE   Renal disorder    Retained ureteral stent    Urgency of urination    Wears glasses     Past Surgical History:  Procedure Laterality Date   CARPAL TUNNEL RELEASE Right 1996   CESAREAN SECTION  1992   CYSTOSCOPY WITH URETEROSCOPY Left 10/19/2016   Procedure: CYSTOSCOPY WITH LEFT  URETEROSCOPY WITH URETERAL STENT REMOVAL;  Surgeon: Crist Fat, MD;  Location: [facility];  Service: Urology;  Laterality: Left;   LUMBAR MICRODISCECTOMY  1995   L4 -- L5   NEPHROLITHOTOMY Left 09/27/2016   Procedure: NEPHROLITHOTOMY PERCUTANEOUS WITH SURGEON ACCESS;  Surgeon: Crist Fat, MD;  Location: [facility];  Service: Urology;  Laterality: Left;   RECONSTRUCTION OF CORACOCLAVICULAR LIGAMENT Left 01/27/2023   Procedure: RECONSTRUCTION OF ACROMIOCLAVICULAR WITH HAMSTRING ALLOGRAFT SEMI-T;  Surgeon: Yolonda Kida, MD;  Location: [facility];  Service: Orthopedics;  Laterality: Left;  120   SHOULDER ARTHROSCOPY Left 01/27/2023   Procedure: ARTHROSCOPY SHOULDER;  Surgeon: Yolonda Kida, MD;  Location:  SURGERY CENTER;  Service: Orthopedics;  Laterality: Left;  120   TOTAL ABDOMINAL HYSTERECTOMY W/ BILATERAL SALPINGOOPHORECTOMY  02/06/2009   TUBAL LIGATION Bilateral 1995  History reviewed. No pertinent family history.  Social History   Socioeconomic History   Marital status: Single    Spouse name: Not on file   Number of children: Not on file   Years of education: Not on file   Highest education level: Associate degree: academic program  Occupational History   Not on file  Tobacco Use   Smoking status: Every Day    Current packs/day: 0.50    Average packs/day: 0.5 packs/day for 40.0 years (20.0 ttl pk-yrs)    Types: Cigarettes   Smokeless tobacco: Never  Vaping Use   Vaping status: Never Used  Substance and Sexual Activity   Alcohol use:  No   Drug use: No   Sexual activity: Not on file  Other Topics Concern   Not on file  Social History Narrative   Not on file   Social Drivers of Health   Financial Resource Strain: Low Risk  (06/22/2023)   Overall Financial Resource Strain (CARDIA)    Difficulty of Paying Living Expenses: Not very hard  Food Insecurity: Food Insecurity Present (06/22/2023)   Hunger Vital Sign    Worried About Running Out of Food in the Last Year: Sometimes true    Ran Out of Food in the Last Year: Sometimes true  Transportation Needs: No Transportation Needs (06/22/2023)   PRAPARE - Administrator, Civil Service (Medical): No    Lack of Transportation (Non-Medical): No  Physical Activity: Unknown (06/22/2023)   Exercise Vital Sign    Days of Exercise per Week: 0 days    Minutes of Exercise per Session: Not on file  Stress: No Stress Concern Present (06/22/2023)   [employer] of Occupational Health - Occupational Stress Questionnaire    Feeling of Stress : Only a little  Social Connections: Socially Isolated (06/22/2023)   Social Connection and Isolation Panel [NHANES]    Frequency of Communication with Friends and Family: More than three times a week    Frequency of Social Gatherings with Friends and Family: Twice a week    Attends Religious Services: Never    Database administrator or Organizations: No    Attends Engineer, structural: Not on file    Marital Status: Divorced  Intimate Partner Violence: Unknown (01/25/2022)   Received from [employer], [facility]   HITS    Physically Hurt: Not on file    Insult or Talk Down To: Not on file    Threaten Physical Harm: Not on file    Scream or Curse: Not on file    Review of Systems  Constitutional:  Negative for chills and fever.  Respiratory:  Positive for cough.   All other systems reviewed and are negative.       Objective    BP 119/81 (BP Location: Right Arm, Patient Position: Sitting, Cuff Size:  Normal)   Pulse 68   Temp 98.4 F (36.9 C) (Oral)   Resp 18   Ht 5\' 9"  (1.753 m)   Wt 196 lb 3.2 oz (89 kg)   SpO2 94%   BMI 28.97 kg/m   Physical Exam Vitals and nursing note reviewed.  Constitutional:      General: She is not in acute distress. Cardiovascular:     Rate and Rhythm: Normal rate and regular rhythm.  Pulmonary:     Effort: Pulmonary effort is normal.     Breath sounds: Normal breath sounds.  Abdominal:     Palpations: Abdomen is soft.  Tenderness: There is no abdominal tenderness.  Neurological:     General: No focal deficit present.     Mental Status: She is alert and oriented to person, place, and time.         Assessment & Plan:   Acute cough -     COVID-19, Flu A+B and RSV     No follow-ups on file.   Tommie Raymond, MD

## 2023-07-27 ENCOUNTER — Other Ambulatory Visit: Payer: Self-pay | Admitting: Family

## 2023-07-27 NOTE — Telephone Encounter (Signed)
Requested medication (s) are due for refill today -yes  Requested medication (s) are on the active medication list -yes  Future visit scheduled -yes  Last refill: unsure  Notes to clinic: both medications are listed as historical-  will need PCP revielw for refill  Requested Prescriptions  Pending Prescriptions Disp Refills   rosuvastatin (CRESTOR) 10 MG tablet       Cardiovascular:  Antilipid - Statins 2 Failed - 07/27/2023 11:24 AM      Failed - Lipid Panel in normal range within the last 12 months    Cholesterol, Total  Date Value Ref Range Status  06/26/2023 137 100 - 199 mg/dL Final   LDL Chol Calc (NIH)  Date Value Ref Range Status  06/26/2023 72 0 - 99 mg/dL Final   HDL  Date Value Ref Range Status  06/26/2023 40 >39 mg/dL Final   Triglycerides  Date Value Ref Range Status  06/26/2023 141 0 - 149 mg/dL Final         Passed - Cr in normal range and within 360 days    Creatinine, Ser  Date Value Ref Range Status  06/26/2023 0.97 0.57 - 1.00 mg/dL Final         Passed - Patient is not pregnant      Passed - Valid encounter within last 12 months    Recent Outpatient Visits           1 week ago Acute cough   [facility], MD   1 month ago Encounter to establish care   [facility] at [facility] [place], NP       Future Appointments             In 1 month Rema Fendt, NP [facility] at [facility]             losartan-hydrochlorothiazide [facility]) 50-12.5 MG tablet 30 tablet     Sig: Take 1 tablet by mouth daily.     Cardiovascular: ARB + Diuretic Combos Passed - 07/27/2023 11:24 AM      Passed - K in normal range and within 180 days    Potassium  Date Value Ref Range Status  06/26/2023 3.8 3.5 - 5.2 mmol/L Final         Passed - Na in normal range and within 180 days    Sodium  Date Value Ref Range Status  06/26/2023 144 134 - 144 mmol/L Final          Passed - Cr in normal range and within 180 days    Creatinine, Ser  Date Value Ref Range Status  06/26/2023 0.97 0.57 - 1.00 mg/dL Final         Passed - eGFR is 10 or above and within 180 days    GFR calc Af Amer  Date Value Ref Range Status  09/28/2016 >60 >60 mL/min Final    Comment:    (NOTE) The eGFR has been calculated using the CKD EPI equation. This calculation has not been validated in all clinical situations. eGFR's persistently <60 mL/min signify possible Chronic Kidney Disease.    GFR, Estimated  Date Value Ref Range Status  01/23/2023 >60 >60 mL/min Final    Comment:    (NOTE) Calculated using the CKD-EPI Creatinine Equation (2021)    eGFR  Date Value Ref Range Status  06/26/2023 67 >59 mL/min/1.73 Final  Passed - Patient is not pregnant      Passed - Last BP in normal range    BP Readings from Last 1 Encounters:  07/20/23 119/81         Passed - Valid encounter within last 6 months    Recent Outpatient Visits           1 week ago Acute cough   [place], MD   1 month ago Encounter to establish care   [facility] at [facility] [place], NP       Future Appointments             In 1 month Rema Fendt, NP [place] [facility]               Requested Prescriptions  Pending Prescriptions Disp Refills   rosuvastatin (CRESTOR) 10 MG tablet       Cardiovascular:  Antilipid - Statins 2 Failed - 07/27/2023 11:24 AM      Failed - Lipid Panel in normal range within the last 12 months    Cholesterol, Total  Date Value Ref Range Status  06/26/2023 137 100 - 199 mg/dL Final   LDL Chol Calc (NIH)  Date Value Ref Range Status  06/26/2023 72 0 - 99 mg/dL Final   HDL  Date Value Ref Range Status  06/26/2023 40 >39 mg/dL Final   Triglycerides  Date Value Ref Range Status  06/26/2023 141 0 - 149 mg/dL Final         Passed - Cr in  normal range and within 360 days    Creatinine, Ser  Date Value Ref Range Status  06/26/2023 0.97 0.57 - 1.00 mg/dL Final         Passed - Patient is not pregnant      Passed - Valid encounter within last 12 months    Recent Outpatient Visits           1 week ago Acute cough   [place] [facility], MD   1 month ago Encounter to establish care   [facility] at [facility] [place], [place]       Future Appointments             In 1 month Rema Fendt, NP [facility] at [facility]             losartan-hydrochlorothiazide [facility]) 50-12.5 MG tablet 30 tablet     Sig: Take 1 tablet by mouth daily.     Cardiovascular: ARB + Diuretic Combos Passed - 07/27/2023 11:24 AM      Passed - K in normal range and within 180 days    Potassium  Date Value Ref Range Status  06/26/2023 3.8 3.5 - 5.2 mmol/L Final         Passed - Na in normal range and within 180 days    Sodium  Date Value Ref Range Status  06/26/2023 144 134 - 144 mmol/L Final         Passed - Cr in normal range and within 180 days    Creatinine, Ser  Date Value Ref Range Status  06/26/2023 0.97 0.57 - 1.00 mg/dL Final         Passed - eGFR is 10 or above and within 180 days    GFR calc Af Denyse Dago  Date  Value Ref Range Status  09/28/2016 >60 >60 mL/min Final    Comment:    (NOTE) The eGFR has been calculated using the CKD EPI equation. This calculation has not been validated in all clinical situations. eGFR's persistently <60 mL/min signify possible Chronic Kidney Disease.    GFR, Estimated  Date Value Ref Range Status  01/23/2023 >60 >60 mL/min Final    Comment:    (NOTE) Calculated using the CKD-EPI Creatinine Equation (2021)    eGFR  Date Value Ref Range Status  06/26/2023 67 >59 mL/min/1.73 Final         Passed - Patient is not pregnant      Passed - Last BP in normal range    BP Readings from Last 1 Encounters:   07/20/23 119/81         Passed - Valid encounter within last 6 months    Recent Outpatient Visits           1 week ago Acute cough   [place] [facility], MD   1 month ago Encounter to establish care   [facility] [place], [place]       Future Appointments             In 1 month Rema Fendt, NP [facility] at [facility]

## 2023-07-27 NOTE — Telephone Encounter (Signed)
Medication Refill -  Most Recent Primary Care Visit:  Provider: Georganna Skeans  Department: [facility]  Visit Type: OFFICE VISIT  Date: 07/20/2023  Medication:  rosuvastatin (CRESTOR) 10 MG tablet losartan-hydrochlorothiazide (HYZAAR) 50-12.5 MG tablet  Has the patient contacted their pharmacy? No (Agent: If no, request that the patient contact the pharmacy for the refill. If patient does not wish to contact the pharmacy document the reason why and proceed with request.) (Agent: If yes, when and what did the pharmacy advise?) Pt states when she first started w/ Amy, these scripts were not due. Now she is running out  Is this the correct pharmacy for this prescription? Yes If no, delete pharmacy and type the correct one.  This is the patient's preferred pharmacy:  [facility] [address] [place] - [address] [place] [address] Phone: [phone] Fax: [phone]   Has the prescription been filled recently? No  Is the patient out of the medication? No  Has the patient been seen for an appointment in the last year OR does the patient have an upcoming appointment? Yes  Can we respond through MyChart? Yes  Agent: Please be advised that Rx refills may take up to 3 business days. We ask that you follow-up with your pharmacy.

## 2023-07-31 ENCOUNTER — Other Ambulatory Visit: Payer: Self-pay | Admitting: Family

## 2023-07-31 ENCOUNTER — Telehealth: Payer: Self-pay | Admitting: Family

## 2023-07-31 DIAGNOSIS — E785 Hyperlipidemia, unspecified: Secondary | ICD-10-CM

## 2023-07-31 DIAGNOSIS — I10 Essential (primary) hypertension: Secondary | ICD-10-CM

## 2023-07-31 MED ORDER — ROSUVASTATIN CALCIUM 10 MG PO TABS
10.0000 mg | ORAL_TABLET | Freq: Every day | ORAL | 0 refills | Status: DC
Start: 2023-07-31 — End: 2023-09-20

## 2023-07-31 MED ORDER — LOSARTAN POTASSIUM-HCTZ 50-12.5 MG PO TABS
1.0000 | ORAL_TABLET | Freq: Every day | ORAL | 0 refills | Status: DC
Start: 2023-07-31 — End: 2023-09-20

## 2023-07-31 NOTE — Telephone Encounter (Signed)
Rosuvastatin prescribed 07/31/2023.

## 2023-07-31 NOTE — Telephone Encounter (Signed)
Complete

## 2023-07-31 NOTE — Telephone Encounter (Signed)
Medication Refill -  Most Recent Primary Care Visit:  Provider: Georganna Skeans  Department: [facility]  Visit Type: OFFICE VISIT  Date: 07/20/2023  Medication: rosuvastatin (CRESTOR) 10 MG tablet [[id number]]  Historical Provider.Previous provider no longer a patient at that office Established Care with Amy 06/26/2023 At New Patient Apt patient was asked did she need refills - Pt decline. Stated that she did not at that time.  Has the patient contacted their pharmacy? Yes (Agent: If no, request that the patient contact the pharmacy for the refill. If patient does not wish to contact the pharmacy document the reason why and proceed with request.) (Agent: If yes, when and what did the pharmacy advise?)  Is this the correct pharmacy for this prescription? Yes If no, delete pharmacy and type the correct one.  This is the patient's preferred pharmacy:  [id number] [place], [place] - [address] [place] [address] Phone: [phone] Fax: [phone]  [id number] [place], [place] - [address] [place] [address] Phone: [phone] Fax: [phone]   Has the prescription been filled recently? NO   Is the patient out of the medication? Yes  Has the patient been seen for an appointment in the last year OR does the patient have an upcoming appointment? Yes  Can we respond through MyChart? Yes  Agent: Please be advised that Rx refills may take up to 3 business days. We ask that you follow-up with your pharmacy.

## 2023-07-31 NOTE — Telephone Encounter (Signed)
This is the 2nd request for Losartan-hydrochlorothiazide, first request on 07/27/23 and routed to the provider.

## 2023-07-31 NOTE — Telephone Encounter (Signed)
Medication Refill -  Most Recent Primary Care Visit:  Provider: Georganna Skeans  Department: [facility]  Visit Type: OFFICE VISIT  Date: 07/20/2023  Medication: losartan-hydrochlorothiazide (HYZAAR) 50-12.5 MG tablet [[id number]]  New Patient of Amy's    Has the patient contacted their pharmacy? Yes (Agent: If no, request that the patient contact the pharmacy for the refill. If patient does not wish to contact the pharmacy document the reason why and proceed with request.) (Agent: If yes, when and what did the pharmacy advise?)  Is this the correct pharmacy for this prescription? Yes If no, delete pharmacy and type the correct one.  This is the patient's preferred pharmacy:  [facility] [address], [place] - [address] [place] [address] Phone: [phone] Fax: [phone]     Has the prescription been filled recently? No  Is the patient out of the medication? No 3 pills left  Has the patient been seen for an appointment in the last year OR does the patient have an upcoming appointment? Yes  Can we respond through MyChart? Yes  Agent: Please be advised that Rx refills may take up to 3 business days. We ask that you follow-up with your pharmacy.

## 2023-07-31 NOTE — Telephone Encounter (Signed)
This is the 2nd request. First request on 07/27/23 in a separate refill encounter and sent to the office for the provider to review.

## 2023-08-04 ENCOUNTER — Other Ambulatory Visit: Payer: Self-pay | Admitting: Family

## 2023-08-04 NOTE — Telephone Encounter (Signed)
Medication Refill -  Most Recent Primary Care Visit:  Provider: Georganna Skeans  Department: [facility]  Visit Type: OFFICE VISIT  Date: 07/20/2023  Medication: PARoxetine (PAXIL) 40 MG tablet   Has the patient contacted their pharmacy? Yes  Is this the correct pharmacy for this prescription? Yes If no, delete pharmacy and type the correct one.  This is the patient's preferred pharmacy:  [facility] [address] [place] - [address] [place] [address] Phone: [phone] Fax: [phone]   Has the prescription been filled recently? Yes  Is the patient out of the medication? Has a few days left   Has the patient been seen for an appointment in the last year OR does the patient have an upcoming appointment? Yes  Can we respond through MyChart? Yes  Agent: Please be advised that Rx refills may take up to 3 business days. We ask that you follow-up with your pharmacy.

## 2023-08-09 NOTE — Telephone Encounter (Signed)
 Requested medication (s) are due for refill today: yes  Requested medication (s) are on the active medication list: no  Last refill:  09/25/2016 historical med  Future visit scheduled: yes  Notes to clinic:  historical provider   Requested Prescriptions  Pending Prescriptions Disp Refills   PARoxetine  (PAXIL ) 40 MG tablet 30 tablet     Sig: Take 1 tablet (40 mg total) by mouth every morning.     Psychiatry:  Antidepressants - SSRI Passed - 08/09/2023 10:52 AM      Passed - Valid encounter within last 6 months    Recent Outpatient Visits           2 weeks ago Acute cough   [place] [facility], MD   1 month ago Encounter to establish care   [facility] [place], [place]       Future Appointments             In 1 month Lorren Greig PARAS, NP [facility]

## 2023-08-10 ENCOUNTER — Ambulatory Visit: Payer: Self-pay

## 2023-08-10 ENCOUNTER — Telehealth: Payer: Medicaid Other | Admitting: Nurse Practitioner

## 2023-08-10 DIAGNOSIS — J40 Bronchitis, not specified as acute or chronic: Secondary | ICD-10-CM

## 2023-08-10 MED ORDER — BENZONATATE 100 MG PO CAPS
100.0000 mg | ORAL_CAPSULE | Freq: Three times a day (TID) | ORAL | 0 refills | Status: DC | PRN
Start: 2023-08-10 — End: 2023-12-13

## 2023-08-10 MED ORDER — AZITHROMYCIN 250 MG PO TABS
ORAL_TABLET | ORAL | 0 refills | Status: AC
Start: 2023-08-10 — End: 2023-08-15

## 2023-08-10 NOTE — Telephone Encounter (Signed)
 Chief Complaint: Cough Symptoms: productive cough, green sputum, chest congestion, body aches,  Frequency: constant  Pertinent Negatives: Patient denies fever, chest pain, vomiting, naausea Disposition: [] ED /[] Urgent Care (no appt availability in office) / [] Appointment(In office/virtual)/ [x]  [place] [facility]/ [] Home Care/ [] Refused Recommended Disposition /[] [place] [facility]/ []  Follow-up with PCP Additional Notes: Patient states she has had a productive cough since last weekend that has not improved with OTC treatment. Patient states she has a cough with green sputum, chest congestion, body aches and low energy. Patient states she feels tired. Care advice was given and patient has been scheduled for a virtual urgent care appointment today at 1130 due to no appointment availability in office this week.    Summary: body aches/head congestion   Patient said she is coughing up green mucus, body aches, head/chest congestion and low energy. No appts before 01/07. Please f/u patient     Reason for Disposition  [1] Continuous (nonstop) coughing interferes with work or school AND [2] no improvement using cough treatment per Care Advice  Answer Assessment - Initial Assessment Questions 1. ONSET: When did the cough begin?      Last weekend  2. SEVERITY: How bad is the cough today?      Moderate 3. SPUTUM: Describe the color of your sputum (none, dry cough; clear, white, yellow, green)     Green  4. HEMOPTYSIS: Are you coughing up any blood? If so ask: How much? (flecks, streaks, tablespoons, etc.)     No  5. DIFFICULTY BREATHING: Are you having difficulty breathing? If Yes, ask: How bad is it? (e.g., mild, moderate, severe)    - MILD: No SOB at rest, mild SOB with walking, speaks normally in sentences, can lie down, no retractions, pulse < 100.    - MODERATE: SOB at rest, SOB with minimal exertion and prefers to sit, cannot lie down flat, speaks in phrases, mild  retractions, audible wheezing, pulse 100-120.    - SEVERE: Very SOB at rest, speaks in single words, struggling to breathe, sitting hunched forward, retractions, pulse > 120      Yes, Mild  6. FEVER: Do you have a fever? If Yes, ask: What is your temperature, how was it measured, and when did it start?     I don't know 7. CARDIAC HISTORY: Do you have any history of heart disease? (e.g., heart attack, congestive heart failure)      No  8. LUNG HISTORY: Do you have any history of lung disease?  (e.g., pulmonary embolus, asthma, emphysema)     No  9. PE RISK FACTORS: Do you have a history of blood clots? (or: recent major surgery, recent prolonged travel, bedridden)     No  10. OTHER SYMPTOMS: Do you have any other symptoms? (e.g., runny nose, wheezing, chest pain)       Body aches, chest congestion, low energy,  Protocols used: Cough - Acute Productive-A-AH

## 2023-08-10 NOTE — Progress Notes (Signed)
 Virtual Visit Consent   [name], you are scheduled for a virtual visit with a [facility] provider today. Just as with appointments in the office, your consent must be obtained to participate. Your consent will be active for this visit and any virtual visit you may have with one of our providers in the next 365 days. If you have a MyChart account, a copy of this consent can be sent to you electronically.  As this is a virtual visit, video technology does not allow for your provider to perform a traditional examination. This may limit your provider's ability to fully assess your condition. If your provider identifies any concerns that need to be evaluated in person or the need to arrange testing (such as labs, EKG, etc.), we will make arrangements to do so. Although advances in technology are sophisticated, we cannot ensure that it will always work on either your end or our end. If the connection with a video visit is poor, the visit may have to be switched to a telephone visit. With either a video or telephone visit, we are not always able to ensure that we have a secure connection.  By engaging in this virtual visit, you consent to the provision of healthcare and authorize for your insurance to be billed (if applicable) for the services provided during this visit. Depending on your insurance coverage, you may receive a charge related to this service.  I need to obtain your verbal consent now. Are you willing to proceed with your visit today? [name] has provided verbal consent on 08/10/2023 for a virtual visit (video or telephone). Lauraine Kitty, FNP  Date: 08/10/2023 11:32 AM  Virtual Visit via Video Note   I, Lauraine Kitty, connected with  [name]  ([id number], March 31, 1961) on 08/10/23 at 11:30 AM EST by a video-enabled telemedicine application and verified that I am speaking with the correct person using two identifiers.  Location: Patient: Virtual Visit Location Patient:  Home Provider: Virtual Visit Location Provider: Home Office   I discussed the limitations of evaluation and management by telemedicine and the availability of in person appointments. The patient expressed understanding and agreed to proceed.    History of Present Illness: [name] is a 63 y.o. who identifies as a female who was assigned female at birth, and is being seen today for sinus congestion and a productive cough with green mucous  Symptom onset was 12/28, started to feel much worse in the past 24 hours   She has not had a fever but she has had chills   She has bene using Dayquil and Nyquil with robitussin honey for relief   She denies a history of asthma or need for inhalers in the past  She has had pneumonia in the past   Denies tightness in chest or SOB just feels heavy to breath when trying to produce mucous   Problems:  Patient Active Problem List   Diagnosis Date Noted   Renal stone 09/27/2016    Allergies:  Allergies  Allergen Reactions   Sulfa Antibiotics Rash   Medications:  Current Outpatient Medications:    clonazePAM  (KLONOPIN ) 2 MG tablet, Take 2 mg by mouth at bedtime., Disp: , Rfl:    Continuous Glucose Receiver (DEXCOM G7 RECEIVER) DEVI, 1 each by Does not apply route 4 (four) times daily -  before meals and at bedtime. (Patient not taking: Reported on 07/20/2023), Disp: 1 each, Rfl: 2   Continuous Glucose Sensor (DEXCOM G7  SENSOR) MISC, 1 each by Does not apply route 4 (four) times daily -  before meals and at bedtime. (Patient not taking: Reported on 07/20/2023), Disp: 1 each, Rfl: 2   ibuprofen  (ADVIL ,MOTRIN ) 200 MG tablet, Take 200 mg by mouth every 6 (six) hours as needed., Disp: , Rfl:    JARDIANCE  10 MG TABS tablet, Take 1 tablet by mouth daily., Disp: , Rfl:    losartan -hydrochlorothiazide (HYZAAR) 50-12.5 MG tablet, Take 1 tablet by mouth daily., Disp: 90 tablet, Rfl: 0   lurasidone (LATUDA) 40 MG TABS tablet, Take 40 mg by mouth daily with  breakfast., Disp: , Rfl:    metFORMIN  (GLUCOPHAGE -XR) 500 MG 24 hr tablet, Take 1 tablet by mouth in the morning and at bedtime., Disp: , Rfl:    oxyCODONE  (ROXICODONE ) 5 MG immediate release tablet, Take 1 tablet (5 mg total) by mouth every 4 (four) hours as needed for severe pain or breakthrough pain., Disp: 24 tablet, Rfl: 0   PARoxetine  (PAXIL ) 40 MG tablet, Take 40 mg by mouth every morning. , Disp: , Rfl:    rosuvastatin  (CRESTOR ) 10 MG tablet, Take 1 tablet (10 mg total) by mouth daily., Disp: 90 tablet, Rfl: 0  Observations/Objective: Patient is well-developed, well-nourished in no acute distress.  Resting comfortably  at home.  Head is normocephalic, atraumatic.  No labored breathing.  Speech is clear and coherent with logical content.  Patient is alert and oriented at baseline.    Assessment and Plan:   1. Bronchitis (Primary)  - benzonatate  (TESSALON ) 100 MG capsule; Take 1 capsule (100 mg total) by mouth 3 (three) times daily as needed.  Dispense: 30 capsule; Refill: 0 - azithromycin  (ZITHROMAX ) 250 MG tablet; Take 2 tablets on day 1, then 1 tablet daily on days 2 through 5  Dispense: 6 tablet; Refill: 0 Take antibiotic with food     Follow Up Instructions: I discussed the assessment and treatment plan with the patient. The patient was provided an opportunity to ask questions and all were answered. The patient agreed with the plan and demonstrated an understanding of the instructions.  A copy of instructions were sent to the patient via MyChart unless otherwise noted below.    The patient was advised to call back or seek an in-person evaluation if the symptoms worsen or if the condition fails to improve as anticipated.    Lauraine Kitty, FNP

## 2023-08-25 ENCOUNTER — Other Ambulatory Visit: Payer: Self-pay | Admitting: Family

## 2023-08-25 NOTE — Telephone Encounter (Signed)
Medication Refill -  Most Recent Primary Care Visit:  Provider: Georganna Skeans  Department: [facility]  Visit Type: OFFICE VISIT  Date: 07/20/2023  Medication: JARDIANCE 10 MG TABS tablet [[id number]]    Has the patient contacted their pharmacy? No  This is the patient's preferred pharmacy:  [facility] [address] [place] - [address] [place] [address] Phone: [phone] Fax: [phone]    Has the prescription been filled recently? No  Is the patient out of the medication? No  5 days left   Has the patient been seen for an appointment in the last year OR does the patient have an upcoming appointment? Yes  Can we respond through MyChart? Yes  Agent: Please be advised that Rx refills may take up to 3 business days. We ask that you follow-up with your pharmacy.

## 2023-08-25 NOTE — Telephone Encounter (Signed)
Requested medications are due for refill today.  unsure  Requested medications are on the active medications list.  yes  Last refill. 11/22/2022  Future visit scheduled.   yes  Notes to clinic.  Medication is historical/    Requested Prescriptions  Pending Prescriptions Disp Refills   JARDIANCE 10 MG TABS tablet 30 tablet     Sig: Take 1 tablet (10 mg total) by mouth daily.     Endocrinology:  Diabetes - SGLT2 Inhibitors Passed - 08/25/2023  3:15 PM      Passed - Cr in normal range and within 360 days    Creatinine, Ser  Date Value Ref Range Status  06/26/2023 0.97 0.57 - 1.00 mg/dL Final         Passed - HBA1C is between 0 and 7.9 and within 180 days    Hgb A1c MFr Bld  Date Value Ref Range Status  06/26/2023 6.2 (H) 4.8 - 5.6 % Final    Comment:             Prediabetes: 5.7 - 6.4          Diabetes: >6.4          Glycemic control for adults with diabetes: <7.0          Passed - eGFR in normal range and within 360 days    GFR calc Af Amer  Date Value Ref Range Status  09/28/2016 >60 >60 mL/min Final    Comment:    (NOTE) The eGFR has been calculated using the CKD EPI equation. This calculation has not been validated in all clinical situations. eGFR's persistently <60 mL/min signify possible Chronic Kidney Disease.    GFR, Estimated  Date Value Ref Range Status  01/23/2023 >60 >60 mL/min Final    Comment:    (NOTE) Calculated using the CKD-EPI Creatinine Equation (2021)    eGFR  Date Value Ref Range Status  06/26/2023 67 >59 mL/min/1.73 Final         Passed - Valid encounter within last 6 months    Recent Outpatient Visits           1 month ago Acute cough   [facility], MD   2 months ago Encounter to establish care   [facility] [place], NP       Future Appointments             In 3 weeks Rema Fendt, NP [facility]

## 2023-09-20 ENCOUNTER — Ambulatory Visit (INDEPENDENT_AMBULATORY_CARE_PROVIDER_SITE_OTHER): Payer: Medicaid Other

## 2023-09-20 ENCOUNTER — Ambulatory Visit: Payer: Medicaid Other

## 2023-09-20 ENCOUNTER — Ambulatory Visit (INDEPENDENT_AMBULATORY_CARE_PROVIDER_SITE_OTHER): Payer: Medicaid Other | Admitting: Family

## 2023-09-20 VITALS — BP 124/79 | HR 76 | Temp 98.8°F | Ht 69.0 in | Wt 197.0 lb

## 2023-09-20 DIAGNOSIS — R059 Cough, unspecified: Secondary | ICD-10-CM | POA: Diagnosis not present

## 2023-09-20 DIAGNOSIS — Z13228 Encounter for screening for other metabolic disorders: Secondary | ICD-10-CM

## 2023-09-20 DIAGNOSIS — Z1211 Encounter for screening for malignant neoplasm of colon: Secondary | ICD-10-CM

## 2023-09-20 DIAGNOSIS — L989 Disorder of the skin and subcutaneous tissue, unspecified: Secondary | ICD-10-CM | POA: Diagnosis not present

## 2023-09-20 DIAGNOSIS — I10 Essential (primary) hypertension: Secondary | ICD-10-CM | POA: Diagnosis not present

## 2023-09-20 DIAGNOSIS — R899 Unspecified abnormal finding in specimens from other organs, systems and tissues: Secondary | ICD-10-CM

## 2023-09-20 DIAGNOSIS — Z1231 Encounter for screening mammogram for malignant neoplasm of breast: Secondary | ICD-10-CM

## 2023-09-20 DIAGNOSIS — Z122 Encounter for screening for malignant neoplasm of respiratory organs: Secondary | ICD-10-CM

## 2023-09-20 DIAGNOSIS — E1165 Type 2 diabetes mellitus with hyperglycemia: Secondary | ICD-10-CM

## 2023-09-20 DIAGNOSIS — Z1329 Encounter for screening for other suspected endocrine disorder: Secondary | ICD-10-CM

## 2023-09-20 DIAGNOSIS — Z0000 Encounter for general adult medical examination without abnormal findings: Secondary | ICD-10-CM

## 2023-09-20 DIAGNOSIS — Z1159 Encounter for screening for other viral diseases: Secondary | ICD-10-CM

## 2023-09-20 DIAGNOSIS — E785 Hyperlipidemia, unspecified: Secondary | ICD-10-CM

## 2023-09-20 DIAGNOSIS — Z130 Encounter for screening for diseases of the blood and blood-forming organs and certain disorders involving the immune mechanism: Secondary | ICD-10-CM

## 2023-09-20 DIAGNOSIS — Z114 Encounter for screening for human immunodeficiency virus [HIV]: Secondary | ICD-10-CM

## 2023-09-20 MED ORDER — METFORMIN HCL ER 500 MG PO TB24
500.0000 mg | ORAL_TABLET | Freq: Two times a day (BID) | ORAL | 0 refills | Status: DC
Start: 2023-09-20 — End: 2023-12-27

## 2023-09-20 MED ORDER — JARDIANCE 10 MG PO TABS
10.0000 mg | ORAL_TABLET | Freq: Every day | ORAL | 0 refills | Status: DC
Start: 2023-09-20 — End: 2023-11-27

## 2023-09-20 MED ORDER — ROSUVASTATIN CALCIUM 10 MG PO TABS
10.0000 mg | ORAL_TABLET | Freq: Every day | ORAL | 0 refills | Status: DC
Start: 2023-09-20 — End: 2024-01-15

## 2023-09-20 MED ORDER — LOSARTAN POTASSIUM-HCTZ 50-12.5 MG PO TABS
1.0000 | ORAL_TABLET | Freq: Every day | ORAL | 0 refills | Status: DC
Start: 2023-09-20 — End: 2024-01-15

## 2023-09-20 NOTE — Progress Notes (Signed)
Patient states a mole on leg she wants you to check out.   Wants shingles vaccine.   Wants pneumonia vaccine.

## 2023-09-20 NOTE — Progress Notes (Signed)
Patient ID: [name], female    DOB: 1960/11/18  MRN: [id number]  CC: Annual Exam  Subjective: [name] is a 63 y.o. female who presents for annual exam.   Her concerns today include:  - Doing well on Losartan-Hydrochlorothiazide, no issues/concerns. She does not complain of red flag symptoms such as but not limited to chest pain, shortness of breath, worst headache of life, nausea/vomiting.  - Doing well on Metformin XR and Jardiance, no issues/concerns. Denies red flag symptoms associated with diabetes.  - Doing well on Rosuvastatin, no issues/concerns.  - States in the past she had spots on her thyroid which were biopsied by a specialist and she has not followed up since then. Denies red flag symptoms.  - Reports spot of left lower extremity has gotten smaller since began. Reports she is concerned about the color and the edge of the spot. Denies red flag symptoms.  - Cough persisting. Denies red flag symptoms.  Patient Active Problem List   Diagnosis Date Noted   Renal stone 09/27/2016     Current Outpatient Medications on File Prior to Visit  Medication Sig Dispense Refill   clonazePAM (KLONOPIN) 2 MG tablet Take 2 mg by mouth at bedtime.     ibuprofen (ADVIL,MOTRIN) 200 MG tablet Take 200 mg by mouth every 6 (six) hours as needed.     lurasidone (LATUDA) 40 MG TABS tablet Take 40 mg by mouth daily with breakfast.     PARoxetine (PAXIL) 40 MG tablet Take 40 mg by mouth every morning.      valbenazine (INGREZZA) 40 MG capsule Take 40 mg by mouth daily.     benzonatate (TESSALON) 100 MG capsule Take 1 capsule (100 mg total) by mouth 3 (three) times daily as needed. (Patient not taking: Reported on 09/20/2023) 30 capsule 0   Continuous Glucose Receiver (DEXCOM G7 RECEIVER) DEVI 1 each by Does not apply route 4 (four) times daily -  before meals and at bedtime. (Patient not taking: Reported on 09/20/2023) 1 each 2   Continuous Glucose Sensor (DEXCOM G7 SENSOR) MISC 1 each by  Does not apply route 4 (four) times daily -  before meals and at bedtime. (Patient not taking: Reported on 09/20/2023) 1 each 2   oxyCODONE (ROXICODONE) 5 MG immediate release tablet Take 1 tablet (5 mg total) by mouth every 4 (four) hours as needed for severe pain or breakthrough pain. 24 tablet 0   No current facility-administered medications on file prior to visit.    Allergies  Allergen Reactions   Sulfa Antibiotics Rash    Social History   Socioeconomic History   Marital status: Single    Spouse name: Not on file   Number of children: Not on file   Years of education: Not on file   Highest education level: Associate degree: academic program  Occupational History   Not on file  Tobacco Use   Smoking status: Every Day    Current packs/day: 0.50    Average packs/day: 0.5 packs/day for 40.0 years (20.0 ttl pk-yrs)    Types: Cigarettes   Smokeless tobacco: Never  Vaping Use   Vaping status: Never Used  Substance and Sexual Activity   Alcohol use: No   Drug use: No   Sexual activity: Not on file  Other Topics Concern   Not on file  Social History Narrative   Not on file   Social Drivers of Health   Financial Resource Strain: Low Risk  (06/22/2023)  Overall Financial Resource Strain (CARDIA)    Difficulty of Paying Living Expenses: Not very hard  Food Insecurity: Food Insecurity Present (06/22/2023)   Hunger Vital Sign    Worried About Running Out of Food in the Last Year: Sometimes true    Ran Out of Food in the Last Year: Sometimes true  Transportation Needs: No Transportation Needs (06/22/2023)   PRAPARE - Administrator, Civil Service (Medical): No    Lack of Transportation (Non-Medical): No  Physical Activity: Unknown (06/22/2023)   Exercise Vital Sign    Days of Exercise per Week: 0 days    Minutes of Exercise per Session: Not on file  Stress: No Stress Concern Present (06/22/2023)   [employer] of Occupational Health - Occupational Stress  Questionnaire    Feeling of Stress : Only a little  Social Connections: Socially Isolated (06/22/2023)   Social Connection and Isolation Panel [NHANES]    Frequency of Communication with Friends and Family: More than three times a week    Frequency of Social Gatherings with Friends and Family: Twice a week    Attends Religious Services: Never    Database administrator or Organizations: No    Attends Engineer, structural: Not on file    Marital Status: Divorced  Intimate Partner Violence: Unknown (01/25/2022)   Received from [employer], [facility]   HITS    Physically Hurt: Not on file    Insult or Talk Down To: Not on file    Threaten Physical Harm: Not on file    Scream or Curse: Not on file    No family history on file.  Past Surgical History:  Procedure Laterality Date   CARPAL TUNNEL RELEASE Right 1996   CESAREAN SECTION  1992   CYSTOSCOPY WITH URETEROSCOPY Left 10/19/2016   Procedure: CYSTOSCOPY WITH LEFT  URETEROSCOPY WITH URETERAL STENT REMOVAL;  Surgeon: Crist Fat, MD;  Location: [facility];  Service: Urology;  Laterality: Left;   LUMBAR MICRODISCECTOMY  1995   L4 -- L5   NEPHROLITHOTOMY Left 09/27/2016   Procedure: NEPHROLITHOTOMY PERCUTANEOUS WITH SURGEON ACCESS;  Surgeon: Crist Fat, MD;  Location: [facility];  Service: Urology;  Laterality: Left;   RECONSTRUCTION OF CORACOCLAVICULAR LIGAMENT Left 01/27/2023   Procedure: RECONSTRUCTION OF ACROMIOCLAVICULAR WITH HAMSTRING ALLOGRAFT SEMI-T;  Surgeon: Yolonda Kida, MD;  Location: [facility];  Service: Orthopedics;  Laterality: Left;  120   SHOULDER ARTHROSCOPY Left 01/27/2023   Procedure: ARTHROSCOPY SHOULDER;  Surgeon: Yolonda Kida, MD;  Location: [facility];  Service: Orthopedics;  Laterality: Left;  120   TOTAL ABDOMINAL HYSTERECTOMY W/ BILATERAL SALPINGOOPHORECTOMY  02/06/2009   TUBAL LIGATION Bilateral 1995    ROS: Review of  Systems Negative except as stated above  PHYSICAL EXAM: BP 124/79   Pulse 76   Temp 98.8 F (37.1 C) (Oral)   Ht 5\' 9"  (1.753 m)   Wt 197 lb (89.4 kg)   SpO2 94%   BMI 29.09 kg/m   Physical Exam HENT:     Head: Normocephalic and atraumatic.     Right Ear: Tympanic membrane, ear canal and external ear normal.     Left Ear: Tympanic membrane, ear canal and external ear normal.     Nose: Nose normal.     Mouth/Throat:     Mouth: Mucous membranes are moist.     Pharynx: Oropharynx is clear.  Eyes:     Extraocular Movements: Extraocular movements  intact.     Conjunctiva/sclera: Conjunctivae normal.     Pupils: Pupils are equal, round, and reactive to light.  Neck:     Thyroid: No thyroid mass, thyromegaly or thyroid tenderness.  Cardiovascular:     Rate and Rhythm: Normal rate and regular rhythm.     Pulses: Normal pulses.     Heart sounds: Normal heart sounds.  Pulmonary:     Effort: Pulmonary effort is normal.     Breath sounds: Normal breath sounds.  Chest:     Comments: Patient declined.  Abdominal:     General: Bowel sounds are normal.     Palpations: Abdomen is soft.  Genitourinary:    Comments: Patient declined.  Musculoskeletal:        General: Normal range of motion.     Right shoulder: Normal.     Left shoulder: Normal.     Right upper arm: Normal.     Left upper arm: Normal.     Right elbow: Normal.     Left elbow: Normal.     Right forearm: Normal.     Left forearm: Normal.     Right wrist: Normal.     Left wrist: Normal.     Right hand: Normal.     Left hand: Normal.     Cervical back: Normal, normal range of motion and neck supple.     Thoracic back: Normal.     Lumbar back: Normal.     Right hip: Normal.     Left hip: Normal.     Right upper leg: Normal.     Left upper leg: Normal.     Right knee: Normal.     Left knee: Normal.     Right lower leg: Normal.     Left lower leg: Normal.     Right ankle: Normal.     Left ankle: Normal.      Right foot: Normal.     Left foot: Normal.  Skin:    General: Skin is warm and dry.     Capillary Refill: Capillary refill takes less than 2 seconds.     Comments: Hyperpigmented bump left lower extremity, no drainage.  Neurological:     General: No focal deficit present.     Mental Status: She is alert and oriented to person, place, and time.  Psychiatric:        Mood and Affect: Mood normal.        Behavior: Behavior normal.    ASSESSMENT AND PLAN: 1. Annual physical exam (Primary) - Counseled on 150 minutes of exercise per week as tolerated, healthy eating (including decreased daily intake of saturated fats, cholesterol, added sugars, sodium), STI prevention, and routine healthcare maintenance.  2. Screening for metabolic disorder - Routine screening.  - CMP14+EGFR  3. Screening for deficiency anemia - Routine screening.  - CBC  4. Thyroid disorder screen - Routine screening.  - TSH  5. Encounter for screening for HIV - Routine screening.  - HIV antibody (with reflex)  6. Need for hepatitis C screening test - Routine screening.  - Hepatitis C Antibody  7. Encounter for screening mammogram for malignant neoplasm of breast - Routine screening.  - MM Digital Screening; Future  8. Encounter for screening for lung cancer - Routine screening.  - CT CHEST LUNG CA SCREEN LOW DOSE W/O CM; Future  9. Colon cancer screening - Referral to Gastroenterology for evaluation/management. - Ambulatory referral to Gastroenterology  10. Cough, unspecified type - Patient today  in office with no cardiopulmonary/acute distress.  - Referral to Pulmonology for evaluation/management. - DG Chest 2 View; Future - Ambulatory referral to Pulmonology  11. Bumps on skin - Referral to Dermatology for evaluation/management. - Ambulatory referral to Dermatology  12. Primary hypertension - Continue Losartan-Hydrochlorothiazide as prescribed. - Counseled on blood pressure goal of less than  130/80, low-sodium, DASH diet, medication compliance, and 150 minutes of moderate intensity exercise per week as tolerated. Counseled on medication adherence and adverse effects. - Follow-up with primary provider in 3 months or sooner if needed.  - losartan-hydrochlorothiazide (HYZAAR) 50-12.5 MG tablet; Take 1 tablet by mouth daily.  Dispense: 90 tablet; Refill: 0  13. Type 2 diabetes mellitus with hyperglycemia, without [place]aw[place]-term current use of insulin (HCC) - Continue Metformin XR and Jardiance as prescribed. Counseled on medication adherence/adverse effects.  - Hemoglobin A1c result pending. - Discussed the importance of healthy eating habits, low-carbohydrate diet, low-sugar diet, regular aerobic exercise (at least 150 minutes a week as tolerated) and medication compliance to achieve or maintain control of diabetes. - Follow-up with primary provider as scheduled.  - Hemoglobin A1c - JARDIANCE 10 MG TABS tablet; Take 1 tablet (10 mg total) by mouth daily.  Dispense: 90 tablet; Refill: 0 - metFORMIN (GLUCOPHAGE-XR) 500 MG 24 hr tablet; Take 1 tablet (500 mg total) by mouth in the morning and at bedtime.  Dispense: 180 tablet; Refill: 0  14. Hyperlipidemia, unspecified hyperlipidemia type - Continue Rosuvastatin as prescribed. Counseled on medication adherence/adverse effects.  - Routine screening.  - Follow-up with primary provider as scheduled.  - Lipid panel - rosuvastatin (CRESTOR) 10 MG tablet; Take 1 tablet (10 mg total) by mouth daily.  Dispense: 90 tablet; Refill: 0  15. Abnormal thyroid biopsy - Referral to Endocrinology for evaluation/management.  - Ambulatory referral to Endocrinology    Patient was given the opportunity to ask questions.  Patient verbalized understanding of the plan and was able to repeat key elements of the plan. Patient was given clear instructions to go to Emergency Department or return to medical center if symptoms don't improve, worsen, or new problems  develop.The patient verbalized understanding.   Orders Placed This Encounter  Procedures   MM Digital Screening   CT CHEST LUNG CA SCREEN LOW DOSE W/O CM   DG Chest 2 View   CBC   Lipid panel   CMP14+EGFR   TSH   Hemoglobin A1c   HIV antibody (with reflex)   Hepatitis C Antibody   Ambulatory referral to Gastroenterology   Ambulatory referral to Endocrinology   Ambulatory referral to Dermatology   Ambulatory referral to Pulmonology     Requested Prescriptions   Signed Prescriptions Disp Refills   losartan-hydrochlorothiazide (HYZAAR) 50-12.5 MG tablet 90 tablet 0    Sig: Take 1 tablet by mouth daily.   JARDIANCE 10 MG TABS tablet 90 tablet 0    Sig: Take 1 tablet (10 mg total) by mouth daily.   metFORMIN (GLUCOPHAGE-XR) 500 MG 24 hr tablet 180 tablet 0    Sig: Take 1 tablet (500 mg total) by mouth in the morning and at bedtime.   rosuvastatin (CRESTOR) 10 MG tablet 90 tablet 0    Sig: Take 1 tablet (10 mg total) by mouth daily.    Return in about 1 year (around 09/19/2024) for Physical per patient preference and 3 months chronic conditions.  Rema Fendt, NP

## 2023-09-21 LAB — CBC
Hematocrit: 47.1 % — ABNORMAL HIGH (ref 34.0–46.6)
Hemoglobin: 16.4 g/dL — ABNORMAL HIGH (ref 11.1–15.9)
MCH: 31.1 pg (ref 26.6–33.0)
MCHC: 34.8 g/dL (ref 31.5–35.7)
MCV: 89 fL (ref 79–97)
Platelets: 229 10*3/uL (ref 150–450)
RBC: 5.27 x10E6/uL (ref 3.77–5.28)
RDW: 13.1 % (ref 11.7–15.4)
WBC: 8.3 10*3/uL (ref 3.4–10.8)

## 2023-09-21 LAB — TSH: TSH: 1.71 u[IU]/mL (ref 0.450–4.500)

## 2023-09-21 LAB — CMP14+EGFR
ALT: 19 [IU]/L (ref 0–32)
AST: 22 [IU]/L (ref 0–40)
Albumin: 4.8 g/dL (ref 3.8–4.9)
Alkaline Phosphatase: 117 [IU]/L (ref 44–121)
BUN/Creatinine Ratio: 17 (ref 12–28)
BUN: 16 mg/dL (ref 8–27)
Bilirubin Total: 0.5 mg/dL (ref 0.0–1.2)
CO2: 26 mmol/L (ref 20–29)
Calcium: 10 mg/dL (ref 8.7–10.3)
Chloride: 103 mmol/L (ref 96–106)
Creatinine, Ser: 0.95 mg/dL (ref 0.57–1.00)
Globulin, Total: 2.4 g/dL (ref 1.5–4.5)
Glucose: 123 mg/dL — ABNORMAL HIGH (ref 70–99)
Potassium: 3.7 mmol/L (ref 3.5–5.2)
Sodium: 145 mmol/L — ABNORMAL HIGH (ref 134–144)
Total Protein: 7.2 g/dL (ref 6.0–8.5)
eGFR: 69 mL/min/{1.73_m2} (ref >59)

## 2023-09-21 LAB — LIPID PANEL
Chol/HDL Ratio: 3.7 {ratio} (ref 0.0–4.4)
Cholesterol, Total: 143 mg/dL (ref 100–199)
HDL: 39 mg/dL — ABNORMAL LOW (ref >39)
LDL Chol Calc (NIH): 70 mg/dL (ref 0–99)
Triglycerides: 206 mg/dL — ABNORMAL HIGH (ref 0–149)
VLDL Cholesterol Cal: 34 mg/dL (ref 5–40)

## 2023-09-21 LAB — HEMOGLOBIN A1C
Est. average glucose Bld gHb Est-mCnc: 143 mg/dL
Hgb A1c MFr Bld: 6.6 % — ABNORMAL HIGH (ref 4.8–5.6)

## 2023-09-21 LAB — HIV ANTIBODY (ROUTINE TESTING W REFLEX): HIV Screen 4th Generation wRfx: NONREACTIVE

## 2023-09-21 LAB — HEPATITIS C ANTIBODY: Hep C Virus Ab: NONREACTIVE

## 2023-09-26 ENCOUNTER — Encounter: Payer: Self-pay | Admitting: Family

## 2023-10-02 ENCOUNTER — Telehealth: Payer: Self-pay

## 2023-10-02 NOTE — Telephone Encounter (Signed)
 Routing to office

## 2023-10-02 NOTE — Telephone Encounter (Signed)
 Copied from CRM [id number]. Topic: Referral - Question >> Oct 02, 2023  1:39 PM Everette C wrote: Reason for CRM: The patient would like additional information on the reason and status of their endocrinology referral   Please contact the patient further when possible

## 2023-10-03 NOTE — Telephone Encounter (Signed)
 During 09/20/2023 office visit patient stated in the past she had spots on her thyroid which were biopsied by a specialist and she has not followed up since then. Patient referred to Endocrinology at that time.

## 2023-10-03 NOTE — Telephone Encounter (Signed)
 Patient said that she did not need to be seen at Endocrinology

## 2023-10-05 ENCOUNTER — Encounter: Payer: Self-pay | Admitting: Family

## 2023-10-06 ENCOUNTER — Telehealth: Payer: Self-pay | Admitting: Emergency Medicine

## 2023-10-06 NOTE — Telephone Encounter (Signed)
 Reason for CRM: Claris Gower calling from [facility] calling to request a prior authorization for procedure CPT 71271 on October 13, 2023 Please advise [phone]- [phone]

## 2023-10-06 NOTE — Telephone Encounter (Signed)
Error

## 2023-10-09 ENCOUNTER — Telehealth: Payer: Self-pay | Admitting: Family

## 2023-10-09 NOTE — Telephone Encounter (Signed)
 Medical assistant to complete.

## 2023-10-09 NOTE — Telephone Encounter (Signed)
 Copied from CRM [id number]. Topic: General - Other >> Oct 09, 2023  9:51 AM Everette C wrote: Reason for CRM: Claris Gower -  Imaging has called to request authorization for [place] [address]    [employer] has notified [facility] that prior authorization is needed for the procedure  Please contact the patient's insurance provider further when possible

## 2023-10-13 ENCOUNTER — Ambulatory Visit
Admission: RE | Admit: 2023-10-13 | Discharge: 2023-10-13 | Disposition: A | Discharge status: Home / Self Care | Payer: Medicaid Other | Source: Ambulatory Visit | Attending: Family | Admitting: Family

## 2023-10-13 DIAGNOSIS — Z122 Encounter for screening for malignant neoplasm of respiratory organs: Secondary | ICD-10-CM

## 2023-10-20 ENCOUNTER — Telehealth: Payer: Self-pay

## 2023-10-20 NOTE — Telephone Encounter (Signed)
 Copied from CRM [id number]. Topic: General - Other >> Oct 19, 2023  4:36 PM Emylou G wrote: Reason for CRM: Pls contact patient.. would like imaging results from [facility].. [phone]

## 2023-11-01 ENCOUNTER — Encounter: Payer: Self-pay | Admitting: Family

## 2023-11-01 ENCOUNTER — Other Ambulatory Visit: Payer: Self-pay | Admitting: Family

## 2023-11-01 ENCOUNTER — Ambulatory Visit: Payer: Self-pay

## 2023-11-01 DIAGNOSIS — I7090 Unspecified atherosclerosis: Secondary | ICD-10-CM

## 2023-11-01 NOTE — Telephone Encounter (Signed)
 CT results were reviewed with MD note. Patient was given information and all questions answered.   Copied from CRM [id number]. Topic: Clinical - Lab/Test Results >> Nov 01, 2023  3:12 PM DeAngela L wrote: Reason for CRM: Patient would like further information about labs Reason for Disposition  [1] Other NON-URGENT information for PCP AND [2] does not require PCP response  Answer Assessment - Initial Assessment Questions 1. REASON FOR CALL or QUESTION: "What is your reason for calling today?" or "How can I best help you?" or "What question do you have that I can help answer?"     Patient called about her CT results. Per chart review, information written by provider was told to patient. Patient was given information and all questions were answered. 2. CALLER: Document the source of call. (e.g., laboratory, patient).     Patient  Protocols used: PCP Call - No Triage-A-AH

## 2023-11-07 HISTORY — PX: CLAVICLE SURGERY: SHX598

## 2023-11-27 ENCOUNTER — Other Ambulatory Visit: Payer: Self-pay | Admitting: Family

## 2023-11-27 DIAGNOSIS — E1165 Type 2 diabetes mellitus with hyperglycemia: Secondary | ICD-10-CM

## 2023-11-27 MED ORDER — JARDIANCE 10 MG PO TABS
10.0000 mg | ORAL_TABLET | Freq: Every day | ORAL | 1 refills | Status: DC
Start: 2023-11-27 — End: 2024-03-01

## 2023-11-27 NOTE — Telephone Encounter (Signed)
 Requested Prescriptions  Pending Prescriptions Disp Refills   JARDIANCE  10 MG TABS tablet 90 tablet 1    Sig: Take 1 tablet (10 mg total) by mouth daily.     Endocrinology:  Diabetes - SGLT2 Inhibitors Passed - 11/27/2023  5:47 PM      Passed - Cr in normal range and within 360 days    Creatinine, Ser  Date Value Ref Range Status  09/20/2023 0.95 0.57 - 1.00 mg/dL Final         Passed - HBA1C is between 0 and 7.9 and within 180 days    Hgb A1c MFr Bld  Date Value Ref Range Status  09/20/2023 6.6 (H) 4.8 - 5.6 % Final    Comment:             Prediabetes: 5.7 - 6.4          Diabetes: >6.4          Glycemic control for adults with diabetes: <7.0          Passed - eGFR in normal range and within 360 days    GFR calc Af Amer  Date Value Ref Range Status  09/28/2016 >60 >60 mL/min Final    Comment:    (NOTE) The eGFR has been calculated using the CKD EPI equation. This calculation has not been validated in all clinical situations. eGFR's persistently <60 mL/min signify possible Chronic Kidney Disease.    GFR, Estimated  Date Value Ref Range Status  01/23/2023 >60 >60 mL/min Final    Comment:    (NOTE) Calculated using the CKD-EPI Creatinine Equation (2021)    eGFR  Date Value Ref Range Status  09/20/2023 69 >59 mL/min/1.73 Final         Passed - Valid encounter within last 6 months    Recent Outpatient Visits           2 months ago Annual physical exam   [facility], Amy J, NP   4 months ago Acute cough   [facility], MD   5 months ago Encounter to establish care   [facility], [place], NP       Future Appointments             In 2 months Olinda Bertrand, DO [facility], [employer]

## 2023-11-27 NOTE — Telephone Encounter (Signed)
 Copied from CRM [id number]. Topic: Clinical - Medication Refill >> Nov 27, 2023  8:59 AM Palma Bob wrote: Most Recent Primary Care Visit:  Provider: Lavona Pounds J  Department: [facility]  Visit Type: PHYSICAL  Date: 09/20/2023  Medication: JARDIANCE  10 MG TABS tablet , patient states the pharmacy only prescribed her 60 tabs, she can't get anymore until May 21.  Has the patient contacted their pharmacy? Yes (Agent: If no, request that the patient contact the pharmacy for the refill. If patient does not wish to contact the pharmacy document the reason why and proceed with request.) (Agent: If yes, when and what did the pharmacy advise?)  Is this the correct pharmacy for this prescription? Yes If no, delete pharmacy and type the correct one.  This is the patient's preferred pharmacy:  [facility] [address] [place] - [address] [place] [address] Phone: [phone] Fax: [phone]   Has the prescription been filled recently? No  Is the patient out of the medication? Yes  Has the patient been seen for an appointment in the last year OR does the patient have an upcoming appointment? Yes  Can we respond through MyChart? Yes  Agent: Please be advised that Rx refills may take up to 3 business days. We ask that you follow-up with your pharmacy.

## 2023-11-28 NOTE — Telephone Encounter (Signed)
 Copied from CRM [id number]. Topic: Clinical - Prescription Issue >> Nov 28, 2023 12:32 PM Carlatta H wrote: Reason for CRM: Please call the patient regarding her JARDIANCE  10 MG TABS tablet [[id number] prescirption//she would like to know if her dosage could be higher as well

## 2023-11-29 ENCOUNTER — Ambulatory Visit: Payer: Self-pay | Admitting: Family

## 2023-11-29 NOTE — Telephone Encounter (Signed)
  Called pharmacy first and med will be in tomorrow Called pt and LM on VM to call back.      Copied from CRM [id number]. Topic: Clinical - Prescription Issue >> Nov 28, 2023 12:32 PM Carlatta H wrote: Reason for CRM: Please call the patient regarding her JARDIANCE  10 MG TABS tablet [[id number] prescirption//she would like to know if her dosage could be higher as well >> Nov 29, 2023  2:51 PM Alysia Jumbo S wrote: Patient calling to check the status of medication, Jardiance . Advised patient that prescription was sent to pharmacy on 4/21. Advised patient to contact pharmacy to see if medication is ready for pickup.

## 2023-11-29 NOTE — Telephone Encounter (Signed)
 Copied from CRM [id number]. Topic: Clinical - Prescription Issue >> Nov 28, 2023 12:32 PM Carlatta H wrote: Reason for CRM: Please call the patient regarding her JARDIANCE  10 MG TABS tablet [[id number] prescirption//she would like to know if her dosage could be higher as well >> Nov 29, 2023  2:51 PM Shardie S wrote: Patient calling to check the status of medication, Jardiance . Advised patient that prescription was sent to pharmacy on 4/21. Advised patient to contact pharmacy to see if medication is ready for pickup.

## 2023-11-29 NOTE — Telephone Encounter (Signed)
 Jardiance  prescribed 11/27/2023.

## 2023-11-30 NOTE — Telephone Encounter (Signed)
 Jardiance  prescribed 11/27/2023. Schedule appointment.

## 2023-12-01 NOTE — Telephone Encounter (Signed)
 Called patient and she stated that it has already been filled and there is nothing we need to do on our part

## 2023-12-12 ENCOUNTER — Other Ambulatory Visit: Payer: Self-pay | Admitting: Family

## 2023-12-12 ENCOUNTER — Telehealth: Payer: Self-pay | Admitting: Family

## 2023-12-12 DIAGNOSIS — Z87892 Personal history of anaphylaxis: Secondary | ICD-10-CM

## 2023-12-12 MED ORDER — EPINEPHRINE 0.3 MG/0.3ML IJ SOAJ
0.3000 mg | INTRAMUSCULAR | 2 refills | Status: AC | PRN
Start: 2023-12-12 — End: ?

## 2023-12-12 NOTE — Telephone Encounter (Signed)
 Complete

## 2023-12-12 NOTE — Telephone Encounter (Signed)
 Copied from CRM [id number]. Topic: Clinical - Medication Refill >> Dec 12, 2023 10:59 AM DeAngela L wrote: Most Recent Primary Care Visit:  Provider: Lavona Pounds J  Department: [facility]  Visit Type: PHYSICAL  Date: 09/20/2023  Medication: EpiPen  0.3mg   Has the patient contacted their pharmacy? No, the Dr is aware patient is allergic to Bees so she has had the last epipen  filled 2 years  (Agent: If no, request that the patient contact the pharmacy for the refill. If patient does not wish to contact the pharmacy document the reason why and proceed with request.) (Agent: If yes, when and what did the pharmacy advise?)  Is this the correct pharmacy for this prescription? Yes If no, delete pharmacy and type the correct one.  This is the patient's preferred pharmacy:  [facility] [address], [place] - [address] [place] [address] Phone: [phone] Fax: [phone]    Has the prescription been filled recently? Yes 2 yes ago  Is the patient out of the medication? Yes they are expired now   Has the patient been seen for an appointment in the last year OR does the patient have an upcoming appointment? Yes  Can we respond through MyChart? Yes  Agent: Please be advised that Rx refills may take up to 3 business days. We ask that you follow-up with your pharmacy.

## 2023-12-13 ENCOUNTER — Encounter: Payer: Self-pay | Admitting: Internal Medicine

## 2023-12-13 ENCOUNTER — Ambulatory Visit: Admitting: Internal Medicine

## 2023-12-13 VITALS — BP 118/70 | HR 74 | Ht 69.0 in | Wt 191.2 lb

## 2023-12-13 DIAGNOSIS — F419 Anxiety disorder, unspecified: Secondary | ICD-10-CM | POA: Diagnosis not present

## 2023-12-13 DIAGNOSIS — R053 Chronic cough: Secondary | ICD-10-CM

## 2023-12-13 DIAGNOSIS — J4489 Other specified chronic obstructive pulmonary disease: Secondary | ICD-10-CM

## 2023-12-13 DIAGNOSIS — J439 Emphysema, unspecified: Secondary | ICD-10-CM | POA: Diagnosis not present

## 2023-12-13 LAB — NITRIC OXIDE: Nitric Oxide: 25

## 2023-12-13 NOTE — Patient Instructions (Signed)
 It was a pleasure to see you today!  Please schedule follow up with myself in 1 year.  If my schedule is not open yet, we will contact you with a reminder closer to that time. Please call [phone] if you haven't heard from us  a month before, and always call us  sooner if issues or concerns arise. You can also send us  a message through MyChart, but but aware that this is not to be used for urgent issues and it may take up to 5-7 days to receive a reply. Please be aware that you will likely be able to view your results before I have a chance to respond to them. Please give us  5 business days to respond to any non-urgent results.    Before your next visit I would like you to have: Full set of PFTs  VISIT SUMMARY:  Today, we discussed your recent CT scan findings, which showed emphysema likely caused by your smoking history. You are currently asymptomatic and able to perform daily activities without any breathing issues. We also talked about your smoking habits, your attempts to quit, and how smoking helps you manage anxiety. Additionally, we reviewed your family history of emphysema and lung cancer.  YOUR PLAN:  -EMPHYSEMA: Emphysema is a lung condition that causes shortness of breath due to damage to the air sacs in the lungs, often caused by smoking. Since you are not experiencing any symptoms, no treatment is needed at this time. However, it is crucial to stop smoking to prevent the condition from worsening. We will order a pulmonary function test to establish a baseline for your lung function, provide education on chronic obstructive pulmonary disease (COPD), ensure your immunizations are up to date, and schedule annual low-dose CT scans to monitor for lung cancer.  -NICOTINE DEPENDENCE: Nicotine dependence is an addiction to tobacco products. We discussed various options to help you quit smoking, including medications and behavioral interventions. We recommend starting with a 14 mg nicotine  patch to help you gradually reduce your smoking. We also talked about the importance of support during this process and advised against using vape pens.  -ANXIETY AND DEPRESSION: Anxiety and depression are mental health conditions that can affect your overall well-being. You mentioned that you use smoking to cope with anxiety. We emphasized the importance of managing your anxiety and depression to support your efforts to quit smoking.  INSTRUCTIONS:  Please schedule a pulmonary function test to establish a baseline for your lung function. Ensure your immunizations are current. We will also schedule annual low-dose CT scans to monitor for lung cancer. If you are ready to quit smoking, start using the 14 mg nicotine patch and reach out for support. Continue managing your anxiety and depression with your current medications and consider additional support if needed.  Understanding COPD   What is COPD? COPD stands for chronic obstructive pulmonary (lung) disease. COPD is a general term used for several lung diseases.  COPD is an umbrella term and encompasses other  common diseases in this group like chronic bronchitis and emphysema. Chronic asthma may also be included in this group. While some patients with COPD have only chronic bronchitis or emphysema, most patients have a combination of both.  You might hear these terms used in exchange for one another.   COPD adds to the work of the heart. Diseased lungs may reduce the amount of oxygen that goes to the blood. High blood pressure in blood vessels from the heart to the lungs makes  it difficult for the heart to pump. Lung disease can also cause the body to produce too many red blood cells which may make the blood thicker and harder to pump.   Patients who have COPD with low oxygen levels may develop an enlarged heart (cor pulmonale). This condition weakens the heart and causes increased shortness of breath and swelling in the legs and feet.   Chronic  bronchitis Chronic bronchitis is irritation and inflammation (swelling) of the lining in the bronchial tubes (air passages). The irritation causes coughing and an excess amount of mucus in the airways. The swelling makes it difficult to get air in and out of the lungs. The small, hair-like structures on the inside of the airways (called cilia) may be damaged by the irritation. The cilia are then unable to help clean mucus from the airways.  Bronchitis is generally considered to be chronic when you have: a productive cough (cough up mucus) and shortness of breath that lasts about 3 months or more each year for 2 or more years in a row. Your doctor may define chronic bronchitis differently.   Emphysema Emphysema is the destruction, or breakdown, of the walls of the alveoli (air sacs) located at the end of the bronchial tubes. The damaged alveoli are not able to exchange oxygen and carbon dioxide between the lungs and the blood. The bronchioles lose their elasticity and collapse when you exhale, trapping air in the lungs. The trapped air keeps fresh air and oxygen from entering the lungs.   Who is affected by COPD? Emphysema and chronic bronchitis affect approximately 16 million people in the [place] , or close to 11 percent of the population.   Symptoms of COPD  Shortness of breath  Shortness of breath with mild exercise (walking, using the stairs, etc.)  Chronic, productive cough (with mucus)  A feeling of "tightness" in the chest  Wheezing   What causes COPD? The two primary causes of COPD are cigarette smoking and alpha1-antitrypsin (AAT) deficiency. Air pollution and occupational dusts may also contribute to COPD, especially when the person exposed to these substances is a cigarette smoker.  Cigarette smoke causes COPD by irritating the airways and creating inflammation that narrows the airways, making it more difficult to breathe. Cigarette smoke also causes the cilia to stop working  properly so mucus and trapped particles are not cleaned from the airways. As a result, chronic cough and excess mucus production develop, leading to chronic bronchitis.  In some people, chronic bronchitis and infections can lead to destruction of the small airways, or emphysema.  AAT deficiency, an inherited disorder, can also lead to emphysema. Alpha antitrypsin (AAT) is a protective material produced in the liver and transported to the lungs to help combat inflammation. When there is not enough of the chemical AAT, the body is no longer protected from an enzyme in the white blood cells.   How is COPD diagnosed?  To diagnose COPD, the physician needs to know: Do you smoke?  Have you had chronic exposure to dust or air pollutants?  Do other members of your family have lung disease?  Are you short of breath?  Do you get short of breath with exercise?  Do you have chronic cough and/or wheezing?  Do you cough up excess mucus?  To help with the diagnosis, the physician will conduct a thorough physical exam which includes:  Listening to your lungs and heart  Checking your blood pressure and pulse  Examining your nose and throat  Checking your feet and ankles for swelling   Laboratory and other tests Several laboratory and other tests are needed to confirm a diagnosis of COPD. These tests may include:  Chest X-ray to look for lung changes that could be caused by COPD   Spirometry and pulmonary function tests (PFTs) to determine lung volume and air flow  Pulse oximetry to measure the saturation of oxygen in the blood  Arterial blood gases (ABGs) to determine the amount of oxygen and carbon dioxide in the blood  Exercise testing to determine if the oxygen level in the blood drops during exercise   Treatment In the beginning stages of COPD, there is minimal shortness of breath that may be noticed only during exercise. As the disease progresses, shortness of breath may worsen and you may need to  wear an oxygen device.   To help control other symptoms of COPD, the following treatments and lifestyle changes may be prescribed.  Quitting smoking  Avoiding cigarette smoke and other irritants  Taking medications including: a. bronchodilators b. anti-inflammatory agents c. oxygen d. antibiotics  Maintaining a healthy diet  Following a structured exercise program such as pulmonary rehabilitation Preventing respiratory infections  Controlling stress   If your COPD progresses, you may be eligible to be evaluated for lung volume reduction surgery or lung transplantation. You may also be eligible to participate in certain clinical trials (research studies). Ask your health care providers about studies being conducted in your hospital.   What is the outlook? Although COPD can not be cured, its symptoms can be treated and your quality of life can be improved. Your prognosis or outlook for the future will depend on how well your lungs are functioning, your symptoms, and how well you respond to and follow your treatment plan.

## 2023-12-13 NOTE — Progress Notes (Signed)
 [name]    [id number]    04-29-1961  Primary Care Physician:Stephens, Annalee Barren, NP  Referring Physician: Senaida Dama, NP [address] [place],  [place] [address] Reason for Consultation: chronic cough Date of Consultation: 12/13/2023  Chief complaint:   Chief Complaint  Patient presents with   Pulmonary Consult    Referred by Lavona Pounds, NP for eval of cough.  Pt c/o cough off and on for the past year- occ prod with clear sputum.  Cough tends to be worse first thing in the am.      HPI:  Discussed the use of AI scribe software for clinical note transcription with the patient, who gave verbal consent to proceed.  History of Present Illness [name] is a 63 year old female who presents with emphysema found on a CT scan. She was referred by her primary care doctor for evaluation of emphysema found on a CT scan.  Emphysema was identified on a chest CT scan performed as part of a lung cancer screening due to her extensive smoking history. She has no symptoms of shortness of breath, recurrent pneumonia, or bronchitis and is able to perform all daily activities without any breathing limitations.  She has a significant smoking history, having started at the age of 78. Currently, she smokes half a pack of cigarettes per day, a reduction from her heaviest smoking period when she smoked up to a pack a day during social events. She attempted to quit smoking once successfully for a year but resumed due to social influences and stress. She uses smoking as a means to manage her anxiety, which she attributes to her current medications.  Her family history is notable for emphysema in her maternal grandfather and mother, and her mother also had lung cancer. She lives with her daughter, who does not smoke, and has a work history as a Engineer, site and in Data processing manager.   Social history:  Occupation: has worked as Development worker, community, done Set designer, delivery,  Data processing manager. Exposures: lives with daughter who does not smoke Smoking history: everyday 1/2 ppd smoker.   Social History   Occupational History   Not on file  Tobacco Use   Smoking status: Every Day    Current packs/day: 0.50    Average packs/day: 1 pack/day for 46.3 years (45.2 ttl pk-yrs)    Types: Cigarettes    Start date: 1979   Smokeless tobacco: Never  Vaping Use   Vaping status: Never Used  Substance and Sexual Activity   Alcohol use: No   Drug use: No   Sexual activity: Not on file    Relevant family history:  Family History  Problem Relation Age of Onset   COPD Mother    Emphysema Mother    Lung cancer Mother        smoked   Mesothelioma Father        smoked   Emphysema Maternal Grandfather    COPD Maternal Grandfather     Past Medical History:  Diagnosis Date   Anxiety disorder    Bipolar 1 disorder (HCC)    w/ hx suicide attempt age 71 and 75   Complication of anesthesia    slow to wake   Diabetes mellitus without complication (HCC)    History of kidney stones    History of melanoma excision    2004-- BACK   Hyperlipidemia    Hypertension    OA (osteoarthritis)  KNEE   Renal disorder    Retained ureteral stent    Urgency of urination    Wears glasses     Past Surgical History:  Procedure Laterality Date   CARPAL TUNNEL RELEASE Right 1996   CESAREAN SECTION  1992   CYSTOSCOPY WITH URETEROSCOPY Left 10/19/2016   Procedure: CYSTOSCOPY WITH LEFT  URETEROSCOPY WITH URETERAL STENT REMOVAL;  Surgeon: Andrez Banker, MD;  Location: [facility];  Service: Urology;  Laterality: Left;   LUMBAR MICRODISCECTOMY  1995   L4 -- L5   NEPHROLITHOTOMY Left 09/27/2016   Procedure: NEPHROLITHOTOMY PERCUTANEOUS WITH SURGEON ACCESS;  Surgeon: Andrez Banker, MD;  Location: [facility];  Service: Urology;  Laterality: Left;   RECONSTRUCTION OF CORACOCLAVICULAR LIGAMENT Left 01/27/2023   Procedure: RECONSTRUCTION OF ACROMIOCLAVICULAR WITH  HAMSTRING ALLOGRAFT SEMI-T;  Surgeon: Janeth Medicus, MD;  Location: [facility];  Service: Orthopedics;  Laterality: Left;  120   SHOULDER ARTHROSCOPY Left 01/27/2023   Procedure: ARTHROSCOPY SHOULDER;  Surgeon: Janeth Medicus, MD;  Location: [facility];  Service: Orthopedics;  Laterality: Left;  120   TOTAL ABDOMINAL HYSTERECTOMY W/ BILATERAL SALPINGOOPHORECTOMY  02/06/2009   TUBAL LIGATION Bilateral 1995     Physical Exam: Blood pressure 118/70, pulse 74, height 5\' 9"  (1.753 m), weight 191 lb 3.2 oz (86.7 kg), SpO2 97%. Gen:      No acute distress ENT:  no nasal polyps, mucus membranes moist Lungs:    No increased respiratory effort, symmetric chest wall excursion, clear to auscultation bilaterally, no wheezes or crackles CV:         Regular rate and rhythm; no murmurs, rubs, or gallops.  No pedal edema Abd:      + bowel sounds; soft, non-tender; no distension MSK: no acute synovitis of DIP or PIP joints, no mechanics hands.  Skin:      Warm and dry; no rashes Neuro: normal speech, no focal facial asymmetry Psych: alert and oriented x3, flat affect   Data Reviewed/Medical Decision Making:  Independent interpretation of tests: Imaging:  Review of patient's CT Chest March 2025  images revealed centrilobular emphysema. The patient's images have been independently reviewed by me.    PFTs: I have personally reviewed the patient's PFTs and      No data to display          Labs:  Lab Results  Component Value Date   NA 145 (H) 09/20/2023   K 3.7 09/20/2023   CO2 26 09/20/2023   GLUCOSE 123 (H) 09/20/2023   BUN 16 09/20/2023   CREATININE 0.95 09/20/2023   CALCIUM  10.0 09/20/2023   EGFR 69 09/20/2023   GFRNONAA >60 01/23/2023   Lab Results  Component Value Date   WBC 8.3 09/20/2023   HGB 16.4 (H) 09/20/2023   HCT 47.1 (H) 09/20/2023   MCV 89 09/20/2023   PLT 229 09/20/2023     Immunization status:  Immunization History   Administered Date(s) Administered   Influenza, Seasonal, Injecte, Preservative Fre 06/26/2023   Zoster Recombinant(Shingrix ) 06/26/2023     I reviewed prior external note(s) from pcp  I reviewed the result(s) of the labs and imaging as noted above.   I have ordered pfts   Assessment and Plan Assessment & Plan Emphysema with COPD Emphysema on CT, likely from smoking. Emphasized smoking cessation to prevent progression. No lung cancer signs on CT. Annual low-dose CT scans recommended for early detection. - Order pulmonary function test for baseline lung  function. - Provide COPD education. - Ensure immunizations are current. - Schedule annual low-dose CT scan for lung cancer screening. -   Nicotine dependence Bellina-term smoker, half pack per day. Previous cessation attempt. Smoking for stress relief. Discussed cessation options including medications and behavioral interventions. Advised against vape pens. - Recommend 14 mg nicotine patch for gradual weaning. - Discuss smoking cessation options and support. - Offer support for smoking cessation when ready.  Anxiety and depression Managed with medication. Smoking as anxiety coping mechanism. Emphasized managing anxiety and depression to aid smoking cessation. Smoking Cessation Counseling:  1. The patient is an everyday smoker and symptomatic due to the following condition emphysema 2. The patient is currently pre-contemplative in quitting smoking. 3. I advised patient to quit smoking. 4. We identified patient specific barriers to change.  5. I personally spent 3  minutes counseling the patient regarding tobacco use disorder. 6. We discussed management of stress and anxiety to help with smoking cessation, when applicable. 7. We discussed nicotine replacement therapy, Wellbutrin, Chantix as possible options. 8. I advised setting a quit date. 9. Follow?up arranged with our office to continue ongoing discussions. 10.Resources given to  patient including quit hotline.     Return to Care: Return in about 1 year (around 12/12/2024).  Louie Rover, MD Pulmonary and Critical Care Medicine [facility] [phone]  CC: Senaida Dama, NP

## 2023-12-27 ENCOUNTER — Other Ambulatory Visit: Payer: Self-pay | Admitting: Family

## 2023-12-27 DIAGNOSIS — E1165 Type 2 diabetes mellitus with hyperglycemia: Secondary | ICD-10-CM

## 2024-01-15 ENCOUNTER — Other Ambulatory Visit: Payer: Self-pay | Admitting: Family

## 2024-01-15 DIAGNOSIS — I10 Essential (primary) hypertension: Secondary | ICD-10-CM

## 2024-01-15 DIAGNOSIS — E785 Hyperlipidemia, unspecified: Secondary | ICD-10-CM

## 2024-02-08 ENCOUNTER — Encounter: Payer: Self-pay | Admitting: "Endocrinology

## 2024-02-08 ENCOUNTER — Ambulatory Visit (INDEPENDENT_AMBULATORY_CARE_PROVIDER_SITE_OTHER): Admitting: "Endocrinology

## 2024-02-08 VITALS — BP 130/84 | HR 75 | Ht 69.0 in | Wt 200.0 lb

## 2024-02-08 DIAGNOSIS — E042 Nontoxic multinodular goiter: Secondary | ICD-10-CM | POA: Diagnosis not present

## 2024-02-08 NOTE — Progress Notes (Signed)
 Outpatient Endocrinology Note Tiffany Birmingham, MD  02/08/24   [name] 1961/04/04 [id number]  Referring Provider: Lorren Greig PARAS, NP Primary Care Provider: Lorren Greig PARAS, NP Subjective  No chief complaint on file.   Assessment & Plan  There are no diagnoses linked to this encounter.  [name] is currently not taking any thyroid medication. Patient is currently biochemically euthyroid.  Educated on thyroid axis.  Recommend the following: annual thyroid lab  Repeat lab before next visit or sooner if symptoms of hyperthyroidism or hypothyroidism develop.  Notify us  immediately in case of pregnancy/breastfeeding or significant weight gain or loss. Counseled on compliance and follow up needs.  Thyroid U/S in 12/19/22 at [facility] reported multinodular goiter, no images available   S/p FNA Left Thyroid biopsy (#of nodules= 2) -Left Mid Thyroid nodule (#3) -Left Lower Thyroid Nodule (#4)   01/17/23 Thyroid, Left Mid Lobe; FNA (smears, ThinPrep): Findings consistent with a benign hyperplastic nodule (multinodular goiter). Bethesda Category II.  Thyroid, Left Lower Lobe; FNA (smears, ThinPrep): Findings consistent with a benign hyperplastic nodule (multinodular goiter). Bethesda Category II.  Ordered f/u thyroid U/S  I have reviewed current medications, nurse's notes, allergies, vital signs, past medical and surgical history, family medical history, and social history for this encounter. Counseled patient on symptoms, examination findings, lab findings, imaging results, treatment decisions and monitoring and prognosis. The patient understood the recommendations and agrees with the treatment plan. All questions regarding treatment plan were fully answered.   No follow-ups on file.   Tiffany Birmingham, MD  02/08/24   I have reviewed current medications, nurse's notes, allergies, vital signs, past medical and surgical history, family medical history, and social history  for this encounter. Counseled patient on symptoms, examination findings, lab findings, imaging results, treatment decisions and monitoring and prognosis. The patient understood the recommendations and agrees with the treatment plan. All questions regarding treatment plan were fully answered.   History of Present Illness [name] is a 63 y.o. year old female who presents to our clinic with thyroid nodules diagnosed in 2024.    Symptoms suggestive of HYPOTHYROIDISM:  fatigue Yes, sometimes  weight gain No cold intolerance  No constipation  No  Symptoms suggestive of HYPERTHYROIDISM:  weight loss  No heat intolerance No hyperdefecation  No palpitations  No  Compressive symptoms:  dysphagia  No dysphonia  No positional dyspnea (especially with simultaneous arms elevation)  No  Smokes  Yes On biotin  No Personal history of head/neck surgery/irradiation  No  Planned Procedure: Left Thyroid biopsy (#of nodules= 2) -Left Mid Thyroid nodule (#3) -Left Lower Thyroid Nodule (#4)   01/17/23 Thyroid, Left Mid Lobe; FNA (smears, ThinPrep): Findings consistent with a benign hyperplastic nodule (multinodular goiter). Bethesda Category II.  Thyroid, Left Lower Lobe; FNA (smears, ThinPrep): Findings consistent with a benign hyperplastic nodule (multinodular goiter). Bethesda Category II.   12/19/22 THYROID ULTRASOUND   INDICATION: Nontoxic uninodular goiter   COMPARISON: None   TECHNIQUE: Gray-scale and color Doppler images of the thyroid gland were obtained.   FINDINGS:   ISTHMUS:  - Size: 0.5 cm.   RIGHT LOBE:  - Size: 5.2 x 1.9 x 2.1 cm.  - Echogenicity: Heterogeneous.SABRA   LEFT LOBE:  - Size: 4.6 x 1.8 x 2.2 cm.  - Echogenicity: Heterogeneous.   NODULES:   - Nodule 1:  -- Size: 1.3 x 0.8 x 0.9 cm (Mallozzi x AP x trans).  -- Location: Right mid.   -- Composition:  solid or almost completely solid (2)  -- Echogenicity: hypoechoic (2)  -- Shape: wider-than-tall (0)   -- Margins: smooth (0)  -- Echogenic foci: none (0)   -- ACR TI-RADS total points and risk category: 4 Points - TR4.   - Nodule 2:  -- Size: 1.0 x 0.5 x 0.8 cm (Herder x AP x trans).  -- Location: Left upper.   -- Composition: solid or almost completely solid (2)  -- Echogenicity: hypoechoic (2)  -- Shape: wider-than-tall (0)  -- Margins: smooth (0)  -- Echogenic foci: none (0)   -- ACR TI-RADS total points and risk category: 4 Points - TR4.   - Nodule 3:  -- Size: 1.6 x 1.4 x 1.5 cm (Cloe x AP x trans).  -- Location: Left mid.   -- Composition: solid or almost completely solid (2)  -- Echogenicity: hypoechoic (2)  -- Shape: wider-than-tall (0)  -- Margins: smooth (0)  -- Echogenic foci: none (0)   -- ACR TI-RADS total points and risk category: 4 Points - TR4.   - Nodule 4:  -- Size: 2.2 x 0.9 x 1.6 cm (Mailloux x AP x trans).  -- Location: Left lower.   -- Composition: solid or almost completely solid (2)  -- Echogenicity: hypoechoic (2)  -- Shape: wider-than-tall (0)  -- Margins: smooth (0)  -- Echogenic foci: none (0)   -- ACR TI-RADS total points and risk category: 4 Points - TR4.   IMPRESSION: Multiple thyroid nodules. Largest nodules, #3 and #4 on the left meet criteria for FNA.  1.  Nodule 1: ACR TI-RADS 4: Recommendation: Follow-up ultrasound in 1 year.  2.  Nodule 2: ACR TI-RADS 4: Recommendation: Follow-up ultrasound in 1 year.  3.  Nodule 3: ACR TI-RADS 4: Recommendation: Ultrasound-guided fine needle aspiration.  4.  Nodule 4: ACR TI-RADS 4: Recommendation: Ultrasound-guided fine needle aspiration.    Physical Exam  BP 130/84   Pulse 75   Ht 5' 9 (1.753 m)   Wt 200 lb (90.7 kg)   SpO2 98%   BMI 29.53 kg/m  Constitutional: well developed, well nourished Head: normocephalic, atraumatic, no exophthalmos Eyes: sclera anicteric, no redness Neck: no thyromegaly, no thyroid tenderness; no nodules palpated Lungs: normal respiratory effort Neurology:  alert and oriented, + fine hand tremor Skin: dry, no appreciable rashes Musculoskeletal: no appreciable defects Psychiatric: normal mood and affect  Allergies Allergies  Allergen Reactions   Sulfa Antibiotics Rash    Current Medications Patient's Medications  New Prescriptions   No medications on file  Previous Medications   AUSTEDO XR 12 MG TB24    Take 1 tablet by mouth daily.   CLONAZEPAM  (KLONOPIN ) 2 MG TABLET    Take 1 tablet by mouth at bedtime.   EPINEPHRINE  0.3 MG/0.3 ML IJ SOAJ INJECTION    Inject 0.3 mg into the muscle as needed for anaphylaxis.   IBUPROFEN  (ADVIL ,MOTRIN ) 200 MG TABLET    Take 200 mg by mouth every 6 (six) hours as needed.   JARDIANCE  10 MG TABS TABLET    Take 1 tablet (10 mg total) by mouth daily.   LOSARTAN -HYDROCHLOROTHIAZIDE (HYZAAR) 50-12.5 MG TABLET    TAKE 1 TABLET BY MOUTH EVERY DAY   LURASIDONE (LATUDA) 40 MG TABS TABLET    Take 40 mg by mouth daily with breakfast.   METFORMIN  (GLUCOPHAGE -XR) 500 MG 24 HR TABLET    TAKE 1 TABLET (500 MG TOTAL) BY MOUTH IN THE MORNING AND AT BEDTIME   PAROXETINE  (PAXIL ) 40  MG TABLET    Take 40 mg by mouth every morning.    ROSUVASTATIN  (CRESTOR ) 10 MG TABLET    TAKE 1 TABLET BY MOUTH EVERY DAY  Modified Medications   No medications on file  Discontinued Medications   No medications on file    Past Medical History Past Medical History:  Diagnosis Date   Anxiety disorder    Bipolar 1 disorder (HCC)    w/ hx suicide attempt age 23 and 89   Complication of anesthesia    slow to wake   Diabetes mellitus without complication (HCC)    History of kidney stones    History of melanoma excision    2004-- BACK   Hyperlipidemia    Hypertension    OA (osteoarthritis)    KNEE   Renal disorder    Retained ureteral stent    Urgency of urination    Wears glasses     Past Surgical History Past Surgical History:  Procedure Laterality Date   CARPAL TUNNEL RELEASE Right 1996   CESAREAN SECTION  1992    CYSTOSCOPY WITH URETEROSCOPY Left 10/19/2016   Procedure: CYSTOSCOPY WITH LEFT  URETEROSCOPY WITH URETERAL STENT REMOVAL;  Surgeon: Morene LELON Salines, MD;  [facility];  Service: Urology;  Laterality: Left;   LUMBAR MICRODISCECTOMY  1995   L4 -- L5   NEPHROLITHOTOMY Left 09/27/2016   Procedure: NEPHROLITHOTOMY PERCUTANEOUS WITH SURGEON ACCESS;  Surgeon: Morene LELON Salines, MD;  Location: [facility];  Service: Urology;  Laterality: Left;   RECONSTRUCTION OF CORACOCLAVICULAR LIGAMENT Left 01/27/2023   Procedure: RECONSTRUCTION OF ACROMIOCLAVICULAR WITH HAMSTRING ALLOGRAFT SEMI-T;  Surgeon: Sharl Selinda Dover, MD;  Location: [facility];  Service: Orthopedics;  Laterality: Left;  120   SHOULDER ARTHROSCOPY Left 01/27/2023   Procedure: ARTHROSCOPY SHOULDER;  Surgeon: Sharl Selinda Dover, MD;  Location: [facility];  Service: Orthopedics;  Laterality: Left;  120   TOTAL ABDOMINAL HYSTERECTOMY W/ BILATERAL SALPINGOOPHORECTOMY  02/06/2009   TUBAL LIGATION Bilateral 1995    Family History family history includes COPD in her maternal grandfather and mother; Emphysema in her maternal grandfather and mother; Lung cancer in her mother; Mesothelioma in her father.  Social History Social History   Socioeconomic History   Marital status: Single    Spouse name: Not on file   Number of children: Not on file   Years of education: Not on file   Highest education level: Associate degree: academic program  Occupational History   Not on file  Tobacco Use   Smoking status: Every Day    Current packs/day: 0.50    Average packs/day: 1 pack/day for 46.5 years (45.3 ttl pk-yrs)    Types: Cigarettes    Start date: 1979   Smokeless tobacco: Never  Vaping Use   Vaping status: Never Used  Substance and Sexual Activity   Alcohol use: No   Drug use: No   Sexual activity: Not on file  Other Topics Concern   Not on file  Social History Narrative   Not on  file   Social Drivers of Health   Financial Resource Strain: Low Risk  (06/22/2023)   Overall Financial Resource Strain (CARDIA)    Difficulty of Paying Living Expenses: Not very hard  Food Insecurity: Food Insecurity Present (06/22/2023)   Hunger Vital Sign    Worried About Running Out of Food in the Last Year: Sometimes true    Ran Out of Food in the Last Year: Sometimes true  Transportation Needs: No Transportation Needs (06/22/2023)   PRAPARE - Administrator, Civil Service (Medical): No    Lack of Transportation (Non-Medical): No  Physical Activity: Unknown (06/22/2023)   Exercise Vital Sign    Days of Exercise per Week: 0 days    Minutes of Exercise per Session: Not on file  Stress: No Stress Concern Present (06/22/2023)   [employer] of Occupational Health - Occupational Stress Questionnaire    Feeling of Stress : Only a little  Social Connections: Socially Isolated (06/22/2023)   Social Connection and Isolation Panel    Frequency of Communication with Friends and Family: More than three times a week    Frequency of Social Gatherings with Friends and Family: Twice a week    Attends Religious Services: Never    Database administrator or Organizations: No    Attends Engineer, structural: Not on file    Marital Status: Divorced  Intimate Partner Violence: Unknown (01/25/2022)   Received from [facility]   HITS    Physically Hurt: Not on file    Insult or Talk Down To: Not on file    Threaten Physical Harm: Not on file    Scream or Curse: Not on file    Laboratory Investigations Lab Results  Component Value Date   TSH 1.710 09/20/2023   TSH  02/19/2008    2.141 (NOTE)  Please note change in reference range(s). Test methodology is 3rd generation TSH   FREET4 1.01 02/19/2008     No results found for: TSI   No components found for: TRAB   Lab Results  Component Value Date   CHOL 143 09/20/2023   Lab Results  Component Value Date    HDL 39 (L) 09/20/2023   Lab Results  Component Value Date   LDLCALC 70 09/20/2023   Lab Results  Component Value Date   TRIG 206 (H) 09/20/2023   Lab Results  Component Value Date   CHOLHDL 3.7 09/20/2023   Lab Results  Component Value Date   CREATININE 0.95 09/20/2023   No results found for: GFR    Component Value Date/Time   NA 145 (H) 09/20/2023 0948   K 3.7 09/20/2023 0948   CL 103 09/20/2023 0948   CO2 26 09/20/2023 0948   GLUCOSE 123 (H) 09/20/2023 0948   GLUCOSE 156 (H) 01/23/2023 1318   BUN 16 09/20/2023 0948   CREATININE 0.95 09/20/2023 0948   CALCIUM  10.0 09/20/2023 0948   PROT 7.2 09/20/2023 0948   ALBUMIN 4.8 09/20/2023 0948   AST 22 09/20/2023 0948   ALT 19 09/20/2023 0948   ALKPHOS 117 09/20/2023 0948   BILITOT 0.5 09/20/2023 0948   GFRNONAA >60 01/23/2023 1318   GFRAA >60 09/28/2016 0659      Latest Ref Rng & Units 09/20/2023    9:48 AM 06/26/2023    8:43 AM 01/23/2023    1:18 PM  BMP  Glucose 70 - 99 mg/dL 876  882  843   BUN 8 - 27 mg/dL 16  19  13    Creatinine 0.57 - 1.00 mg/dL 9.04  9.02  9.09   BUN/Creat Ratio 12 - 28 17  20     Sodium 134 - 144 mmol/L 145  144  142   Potassium 3.5 - 5.2 mmol/L 3.7  3.8  3.5   Chloride 96 - 106 mmol/L 103  101  100   CO2 20 - 29 mmol/L 26  26  26  Calcium  8.7 - 10.3 mg/dL 89.9  9.4  9.1        Component Value Date/Time   WBC 8.3 09/20/2023 0948   WBC 8.2 09/26/2016 0906   RBC 5.27 09/20/2023 0948   RBC 4.91 09/26/2016 0906   HGB 16.4 (H) 09/20/2023 0948   HCT 47.1 (H) 09/20/2023 0948   PLT 229 09/20/2023 0948   MCV 89 09/20/2023 0948   MCH 31.1 09/20/2023 0948   MCH 30.3 09/26/2016 0906   MCHC 34.8 09/20/2023 0948   MCHC 35.6 09/26/2016 0906   RDW 13.1 09/20/2023 0948   LYMPHSABS 3.5 02/17/2008 2318   MONOABS 0.4 02/17/2008 2318   EOSABS 0.0 02/17/2008 2318   BASOSABS 0.0 02/17/2008 2318      Parts of this note may have been dictated using voice recognition software. There may be  variances in spelling and vocabulary which are unintentional. Not all errors are proofread. Please notify the dino if any discrepancies are noted or if the meaning of any statement is not clear.

## 2024-02-09 ENCOUNTER — Emergency Department (HOSPITAL_COMMUNITY)
Admission: EM | Admit: 2024-02-09 | Discharge: 2024-02-09 | Disposition: A | Discharge status: Home / Self Care | Attending: Emergency Medicine | Admitting: Emergency Medicine

## 2024-02-09 ENCOUNTER — Emergency Department (HOSPITAL_COMMUNITY)

## 2024-02-09 ENCOUNTER — Other Ambulatory Visit: Payer: Self-pay

## 2024-02-09 ENCOUNTER — Encounter (HOSPITAL_COMMUNITY): Payer: Self-pay

## 2024-02-09 DIAGNOSIS — S42021A Displaced fracture of shaft of right clavicle, initial encounter for closed fracture: Secondary | ICD-10-CM | POA: Insufficient documentation

## 2024-02-09 DIAGNOSIS — S4991XA Unspecified injury of right shoulder and upper arm, initial encounter: Secondary | ICD-10-CM | POA: Diagnosis present

## 2024-02-09 DIAGNOSIS — Y93E5 Activity, floor mopping and cleaning: Secondary | ICD-10-CM | POA: Diagnosis not present

## 2024-02-09 DIAGNOSIS — W010XXA Fall on same level from slipping, tripping and stumbling without subsequent striking against object, initial encounter: Secondary | ICD-10-CM | POA: Diagnosis not present

## 2024-02-09 DIAGNOSIS — M7989 Other specified soft tissue disorders: Secondary | ICD-10-CM | POA: Diagnosis not present

## 2024-02-09 MED ORDER — HYDROCODONE-ACETAMINOPHEN 5-325 MG PO TABS
1.0000 | ORAL_TABLET | Freq: Once | ORAL | Status: AC
  Administered 2024-02-09: 1 via ORAL
  Filled 2024-02-09: qty 1

## 2024-02-09 MED ORDER — HYDROCODONE-ACETAMINOPHEN 5-325 MG PO TABS: 1.0000 | ORAL_TABLET | ORAL | 0 refills | Status: DC | PRN

## 2024-02-09 NOTE — ED Provider Notes (Signed)
 [place] EMERGENCY DEPARTMENT AT [facility] Provider Note   CSN: [id number] Arrival date & time: 02/09/24  8169     Patient presents with: No chief complaint on file.   [name] is a 63 y.o. female.  {Add pertinent medical, surgical, social history, OB history to HPI:915} 63 year old female who is right-handed who presents emergency department after a fall.  Reports that she was cleaning up after a Fourth of July celebration and had a few beers and tripped and fell onto her right shoulder.  Has been having right shoulder pain since then.  No head strike or LOC.  No other injuries.  Not on blood thinners       Prior to Admission medications   Medication Sig Start Date End Date Taking? Authorizing Provider  AUSTEDO XR 12 MG TB24 Take 1 tablet by mouth daily. 12/11/23   [provider]  clonazePAM  (KLONOPIN ) 2 MG tablet Take 1 tablet by mouth at bedtime. 01/13/20   [provider]  EPINEPHrine  0.3 mg/0.3 mL IJ SOAJ injection Inject 0.3 mg into the muscle as needed for anaphylaxis. 12/12/23   Lorren Greig PARAS, NP  ibuprofen  (ADVIL ,MOTRIN ) 200 MG tablet Take 200 mg by mouth every 6 (six) hours as needed.    [provider]  JARDIANCE  10 MG TABS tablet Take 1 tablet (10 mg total) by mouth daily. 11/27/23   Lorren Greig PARAS, NP  losartan -hydrochlorothiazide (HYZAAR) 50-12.5 MG tablet TAKE 1 TABLET BY MOUTH EVERY DAY 01/15/24   Lorren Greig PARAS, NP  lurasidone (LATUDA) 40 MG TABS tablet Take 40 mg by mouth daily with breakfast.    [provider]  metFORMIN  (GLUCOPHAGE -XR) 500 MG 24 hr tablet TAKE 1 TABLET (500 MG TOTAL) BY MOUTH IN THE MORNING AND AT BEDTIME 12/27/23 03/26/24  Lorren Greig PARAS, NP  PARoxetine  (PAXIL ) 40 MG tablet Take 40 mg by mouth every morning.     [provider]  rosuvastatin  (CRESTOR ) 10 MG tablet TAKE 1 TABLET BY MOUTH EVERY DAY 01/15/24   Lorren Greig PARAS, NP    Allergies: Sulfa antibiotics    Review of  Systems  Updated Vital Signs BP 139/74   Pulse 84   Temp 98.4 F (36.9 C) (Oral)   Resp 18   SpO2 97%   Physical Exam Musculoskeletal:     Comments: Tenderness to palpation over right clavicle.  Intact sensation to light touch in the dermatomes of the right upper extremity.  Radial pulse 2+.  Able to make a fist and open hand.  No tenderness to palpation of the wrist or elbow.  Mild tenderness palpation to anterior shoulder.     (all labs ordered are listed, but only abnormal results are displayed) Labs Reviewed - No data to display  EKG: None  Radiology: No results found.  {Document cardiac monitor, telemetry assessment procedure when appropriate:32947} Procedures   Medications Ordered in the ED  HYDROcodone -acetaminophen  (NORCO/VICODIN) 5-325 MG per tablet 1 tablet (has no administration in time range)      {Click here for ABCD2, HEART and other calculators REFRESH Note before signing:1}                              Medical Decision Making Amount and/or Complexity of Data Reviewed Radiology: ordered.  Risk Prescription drug management.   ***  {Document critical care time when appropriate  Document review of labs and clinical decision tools ie CHADS2VASC2, etc  Document your independent review of radiology images and any outside records  Document your discussion with family members, caretakers and with consultants  Document social determinants of health affecting pt's care  Document your decision making why or why not admission, treatments were needed:32947:::1}   Final diagnoses:  None    ED Discharge Orders     None

## 2024-02-09 NOTE — Progress Notes (Signed)
 Orthopedic Tech Progress Note Patient Details:  [name] 14-Nov-1960 [id number]  Ortho Devices Type of Ortho Device: Shoulder immobilizer Ortho Device/Splint Location: RUE Ortho Device/Splint Interventions: Ordered   Post Interventions Instructions Provided: Care of device Patient was in xray when I arrived I will be down once she is finished to apply the splint.   Destin Vinsant [name] 02/09/2024, 8:21 PM

## 2024-02-09 NOTE — ED Triage Notes (Signed)
 Pt BIB [facility] from home d/t falling while trying to move a chair. States that se landed on her Rt side hurting her Rt shoulder, A/Ox4, denies hitting her her or any LOC. Not on thinners has been drinking since she woke this morning, endorses that she has had 1-2 beers each hour since she woke up. 146/70, 80 bpm, 16 resp, 93% on RA, CBG 95.

## 2024-02-09 NOTE — Discharge Instructions (Signed)
 You were seen for your broken collarbone (clavicle) in the emergency department.   At home, please take Tylenol  and ibuprofen  for your pain.  You may also take the Norco we have prescribed you for any breakthrough pain that may have.  Do not take this before driving or operating heavy machinery.  Do not take this medication with alcohol.  Check your MyChart online for the results of any tests that had not resulted by the time you left the emergency department.   Follow-up with orthopedics in 1 week.  Return immediately to the emergency department if you experience any of the following: weakness or numbness of your hand, bone protruding through the skin, or any other concerning symptoms.    Thank you for visiting our Emergency Department. It was a pleasure taking care of you today.

## 2024-02-11 ENCOUNTER — Other Ambulatory Visit: Payer: Self-pay

## 2024-02-11 ENCOUNTER — Emergency Department (HOSPITAL_COMMUNITY)
Admission: EM | Admit: 2024-02-11 | Discharge: 2024-02-11 | Disposition: A | Discharge status: Home / Self Care | Attending: Emergency Medicine | Admitting: Emergency Medicine

## 2024-02-11 ENCOUNTER — Encounter (HOSPITAL_COMMUNITY): Payer: Self-pay

## 2024-02-11 DIAGNOSIS — S42021A Displaced fracture of shaft of right clavicle, initial encounter for closed fracture: Secondary | ICD-10-CM | POA: Insufficient documentation

## 2024-02-11 DIAGNOSIS — M79601 Pain in right arm: Secondary | ICD-10-CM | POA: Diagnosis present

## 2024-02-11 DIAGNOSIS — W19XXXA Unspecified fall, initial encounter: Secondary | ICD-10-CM | POA: Insufficient documentation

## 2024-02-11 DIAGNOSIS — I10 Essential (primary) hypertension: Secondary | ICD-10-CM | POA: Insufficient documentation

## 2024-02-11 DIAGNOSIS — Z79899 Other long term (current) drug therapy: Secondary | ICD-10-CM | POA: Diagnosis not present

## 2024-02-11 MED ORDER — KETOROLAC TROMETHAMINE 15 MG/ML IJ SOLN
15.0000 mg | Freq: Once | INTRAMUSCULAR | Status: AC
  Administered 2024-02-11: 15 mg via INTRAMUSCULAR
  Filled 2024-02-11: qty 1

## 2024-02-11 MED ORDER — FENTANYL CITRATE PF 50 MCG/ML IJ SOSY
50.0000 ug | PREFILLED_SYRINGE | Freq: Once | INTRAMUSCULAR | Status: AC
  Administered 2024-02-11: 50 ug via INTRAMUSCULAR
  Filled 2024-02-11: qty 1

## 2024-02-11 MED ORDER — KETOROLAC TROMETHAMINE 10 MG PO TABS: 10.0000 mg | ORAL_TABLET | Freq: Four times a day (QID) | ORAL | 0 refills | Status: DC | PRN

## 2024-02-11 MED ORDER — METHOCARBAMOL 500 MG PO TABS: 500.0000 mg | ORAL_TABLET | Freq: Two times a day (BID) | ORAL | 0 refills | Status: DC

## 2024-02-11 NOTE — ED Provider Notes (Signed)
 [name] Provider Note   CSN: [id number] Arrival date & time: 02/11/24  1005     Patient presents with: Arm Pain   [name] is a 63 y.o. female with past medical history seen for bipolar disorder, anxiety, hyperlipidemia, hypertension who presents with concern for right sided pain after clavicle fracture.  Reports some tingling in her right thumb going into the 1st and 2nd finger.  Reports no relief with home Norco.    Arm Pain       Prior to Admission medications   Medication Sig Start Date End Date Taking? Authorizing Provider  ketorolac  (TORADOL ) 10 MG tablet Take 1 tablet (10 mg total) by mouth every 6 (six) hours as needed. 02/11/24  Yes Earlyne Feeser H, PA-C  methocarbamol  (ROBAXIN ) 500 MG tablet Take 1 tablet (500 mg total) by mouth 2 (two) times daily. 02/11/24  Yes Lonell Stamos H, PA-C  AUSTEDO XR 12 MG TB24 Take 1 tablet by mouth daily. 12/11/23   [provider]  clonazePAM  (KLONOPIN ) 2 MG tablet Take 1 tablet by mouth at bedtime. 01/13/20   [provider]  EPINEPHrine  0.3 mg/0.3 mL IJ SOAJ injection Inject 0.3 mg into the muscle as needed for anaphylaxis. 12/12/23   Lorren Greig PARAS, NP  HYDROcodone -acetaminophen  (NORCO/VICODIN) 5-325 MG tablet Take 1 tablet by mouth every 4 (four) hours as needed. 02/09/24   Yolande Lamar BROCKS, MD  ibuprofen  (ADVIL ,MOTRIN ) 200 MG tablet Take 200 mg by mouth every 6 (six) hours as needed.    [provider]  JARDIANCE  10 MG TABS tablet Take 1 tablet (10 mg total) by mouth daily. 11/27/23   Lorren Greig PARAS, NP  losartan -hydrochlorothiazide (HYZAAR) 50-12.5 MG tablet TAKE 1 TABLET BY MOUTH EVERY DAY 01/15/24   Lorren Greig PARAS, NP  lurasidone (LATUDA) 40 MG TABS tablet Take 40 mg by mouth daily with breakfast.    [provider]  metFORMIN  (GLUCOPHAGE -XR) 500 MG 24 hr tablet TAKE 1 TABLET (500 MG TOTAL) BY MOUTH IN THE MORNING AND AT BEDTIME 12/27/23 03/26/24   Lorren Greig PARAS, NP  PARoxetine  (PAXIL ) 40 MG tablet Take 40 mg by mouth every morning.     [provider]  rosuvastatin  (CRESTOR ) 10 MG tablet TAKE 1 TABLET BY MOUTH EVERY DAY 01/15/24   Lorren Greig PARAS, NP    Allergies: Sulfa antibiotics    Review of Systems  All other systems reviewed and are negative.   Updated Vital Signs BP (!) 166/83 (BP Location: Left Arm)   Pulse 77   Temp 98.1 F (36.7 C) (Oral)   Resp 20   Ht 5' 9 (1.753 m)   Wt 90.7 kg   SpO2 95%   BMI 29.53 kg/m   Physical Exam Vitals and nursing note reviewed.  Constitutional:      General: She is not in acute distress.    Appearance: Normal appearance.  HENT:     Head: Normocephalic and atraumatic.  Eyes:     General:        Right eye: No discharge.        Left eye: No discharge.  Cardiovascular:     Rate and Rhythm: Normal rate and regular rhythm.     Pulses: Normal pulses.     Heart sounds: No murmur heard.    No friction rub. No gallop.  Pulmonary:     Effort: Pulmonary effort is normal.     Breath sounds: Normal breath sounds.  Abdominal:     General: Bowel sounds are normal.     Palpations: Abdomen is soft.  Musculoskeletal:     Comments: Some tenderness to palpation over the right Clavicle without any skin tenting noted, normal range of motion of the right shoulder, right elbow, right wrist.  She endorses some degree sensation but not total numbness in the right thumb, 1st and 2nd fingers.  Skin:    General: Skin is warm and dry.     Capillary Refill: Capillary refill takes less than 2 seconds.  Neurological:     Mental Status: She is alert and oriented to person, place, and time.  Psychiatric:        Mood and Affect: Mood normal.        Behavior: Behavior normal.     (all labs ordered are listed, but only abnormal results are displayed) Labs Reviewed - No data to display  EKG: None  Radiology: DG Clavicle Right Result Date: 02/09/2024 CLINICAL DATA:  Shoulder pain post  fall EXAM: RIGHT CLAVICLE - 2+ VIEWS COMPARISON:  None Available. FINDINGS: Acute comminuted fracture midportion of right clavicle with greater than 1 shaft diameter inferior displacement of distal fracture fragment and about 1.9 cm of overriding IMPRESSION: Acute comminuted displaced and overriding right clavicle fracture. Electronically Signed   By: Luke Bun M.D.   On: 02/09/2024 20:24   DG Shoulder Right Result Date: 02/09/2024 CLINICAL DATA:  Shoulder pain post fall EXAM: RIGHT SHOULDER - 2+ VIEW COMPARISON:  None Available. FINDINGS: Moderate AC joint degenerative change. No fracture or malalignment at the glenohumeral interval. Acute comminuted and displaced right midclavicular fracture IMPRESSION: Acute comminuted and displaced right midclavicular fracture. Electronically Signed   By: Luke Bun M.D.   On: 02/09/2024 20:23     Procedures   Medications Ordered in the ED  fentaNYL  (SUBLIMAZE ) injection 50 mcg (50 mcg Intramuscular Given 02/11/24 1039)  ketorolac  (TORADOL ) 15 MG/ML injection 15 mg (15 mg Intramuscular Given 02/11/24 1039)                                    Medical Decision Making Risk Prescription drug management.   This is an overall well-appearing 64 year old female who presents with concern for Ongoing pain in the right clavicle with known fracture from 2 days ago.  On my exam patient is neurovascularly intact with some tenderness of the affected right upper extremity, she endorses some decreased sensation in the thumb, 1st and 2nd fingers, normal range of motion throughout the hand, wrist. Spoke with orthopedics, Dr. Fidel with no additional recommendations based on her known fracture other than close follow up, sling, and pain control.  Did discuss some ways to hold the arm in the sling to help with the swelling, discomfort until she follows up.  Will discharge with Robaxin , Toradol  in addition to the Norco that she is using at home.  Final diagnoses:  Closed  displaced fracture of shaft of right clavicle, initial encounter    ED Discharge Orders          Ordered    ketorolac  (TORADOL ) 10 MG tablet  Every 6 hours PRN        02/11/24 1126    methocarbamol  (ROBAXIN ) 500 MG tablet  2 times daily        02/11/24 1126               Calyn Sivils H, PA-C  02/11/24 1126    Randol Simmonds, MD 02/12/24 [phone]

## 2024-02-11 NOTE — ED Notes (Signed)
 D/C instructions reviewed. Pt expressed understanding.

## 2024-02-11 NOTE — Progress Notes (Signed)
 Orthopedic Tech Progress Note Patient Details:  [name] 08-Dec-1960 [id number]  A smaller sling was placed to the RUE, this appeared to fit much better. The pt states her arm feels more supported than before.   Ortho Devices Type of Ortho Device: Arm sling Ortho Device/Splint Location: RUE Ortho Device/Splint Interventions: Ordered, Application, Adjustment   Post Interventions Patient Tolerated: Well Instructions Provided: Care of device, Adjustment of device  Juhi Lagrange Ronal Brasil 02/11/2024, 11:07 AM

## 2024-02-11 NOTE — ED Triage Notes (Signed)
 Pt states she fell and broke her clavicle 2 days ago and since then pain in right shoulder/arm has not ceased and is now having numbness in right hand. Pt wearing sling and has arm propped on pillow. Pt has taken ibuprofen  this am, states pain meds do not help.

## 2024-02-11 NOTE — Discharge Instructions (Addendum)
 Please use Tylenol  or ibuprofen for pain.  You may use 600 mg ibuprofen every 6 hours or 1000 mg of Tylenol  every 6 hours.  You may choose to alternate between the 2.  This would be most effective.  Not to exceed 4 g of Tylenol  within 24 hours.  Not to exceed 3200 mg ibuprofen 24 hours.  You can use the muscle relaxant I am prescribing in addition to the above to help with any breakthrough pain.  You can take it up to twice daily.  It is safe to take at night, but I would be cautious taking it during the day as it can cause some drowsiness.  Make sure that you are feeling awake and alert before you get behind the wheel of a car or operate a motor vehicle.  It is not a narcotic pain medication so you are able to take it if it is not making you drowsy and still pilot a vehicle or machinery safely.  You can use the stronger narcotic pain medication in place of Tylenol  for severe break through pain.  If you take the narcotic pain medication that we prescribed recommend that you also take a laxative such as MiraLAX or Dulcolax every day that you take the narcotic pain medicine, and drink plenty of fluids, 50 to 64 ounces to prevent any constipation.

## 2024-02-13 ENCOUNTER — Encounter

## 2024-02-14 ENCOUNTER — Ambulatory Visit: Admitting: Cardiology

## 2024-02-20 ENCOUNTER — Ambulatory Visit (HOSPITAL_COMMUNITY)

## 2024-02-26 ENCOUNTER — Ambulatory Visit (HOSPITAL_COMMUNITY)
Admission: RE | Admit: 2024-02-26 | Discharge: 2024-02-26 | Disposition: A | Discharge status: Home / Self Care | Source: Ambulatory Visit | Attending: "Endocrinology | Admitting: "Endocrinology

## 2024-02-26 DIAGNOSIS — E042 Nontoxic multinodular goiter: Secondary | ICD-10-CM | POA: Diagnosis present

## 2024-02-27 ENCOUNTER — Other Ambulatory Visit: Payer: Self-pay | Admitting: Family

## 2024-02-27 DIAGNOSIS — I10 Essential (primary) hypertension: Secondary | ICD-10-CM

## 2024-02-28 ENCOUNTER — Other Ambulatory Visit: Payer: Self-pay

## 2024-02-29 ENCOUNTER — Ambulatory Visit (HOSPITAL_COMMUNITY)

## 2024-03-01 ENCOUNTER — Other Ambulatory Visit: Payer: Self-pay | Admitting: Family

## 2024-03-01 DIAGNOSIS — E1165 Type 2 diabetes mellitus with hyperglycemia: Secondary | ICD-10-CM

## 2024-03-01 NOTE — Telephone Encounter (Signed)
 Copied from CRM [id number]. Topic: Clinical - Medication Refill >> Mar 01, 2024  4:20 PM Everette C wrote: Medication: JARDIANCE  10 MG TABS tablet [[id number]]  Has the patient contacted their pharmacy? Yes (Agent: If no, request that the patient contact the pharmacy for the refill. If patient does not wish to contact the pharmacy document the reason why and proceed with request.) (Agent: If yes, when and what did the pharmacy advise?)  This is the patient's preferred pharmacy:  [id number] [place], [place] - [address] [place] [address] Phone: [phone] Fax: [phone]  Is this the correct pharmacy for this prescription? Yes If no, delete pharmacy and type the correct one.   Has the prescription been filled recently? Yes  Is the patient out of the medication? No  Has the patient been seen for an appointment in the last year OR does the patient have an upcoming appointment? Yes  Can we respond through MyChart? No  Agent: Please be advised that Rx refills may take up to 3 business days. We ask that you follow-up with your pharmacy.

## 2024-03-04 ENCOUNTER — Telehealth: Payer: Self-pay

## 2024-03-04 MED ORDER — JARDIANCE 10 MG PO TABS: 10.0000 mg | ORAL_TABLET | Freq: Every day | ORAL | 0 refills | Status: DC

## 2024-03-04 NOTE — Telephone Encounter (Signed)
 Copied from CRM [id number]. Topic: Clinical - Prescription Issue >> Mar 04, 2024  1:54 PM [name] wrote: Reason for CRM: Pt called to report that her PCP checked the box that her Jardiance  is not helping her. Pt believes this was an accident and that her PCP needs to change this in order to have her insurance cover it. Needs it resent stating that the medication is indeed helping her. Please advise   Pt would like a call back from the clinic Best contact: [phone]

## 2024-03-04 NOTE — Telephone Encounter (Signed)
 Requested Prescriptions  Pending Prescriptions Disp Refills   JARDIANCE  10 MG TABS tablet 90 tablet 0    Sig: Take 1 tablet (10 mg total) by mouth daily.     Endocrinology:  Diabetes - SGLT2 Inhibitors Passed - 03/04/2024 12:40 PM      Passed - Cr in normal range and within 360 days    Creatinine, Ser  Date Value Ref Range Status  09/20/2023 0.95 0.57 - 1.00 mg/dL Final         Passed - HBA1C is between 0 and 7.9 and within 180 days    Hgb A1c MFr Bld  Date Value Ref Range Status  09/20/2023 6.6 (H) 4.8 - 5.6 % Final    Comment:             Prediabetes: 5.7 - 6.4          Diabetes: >6.4          Glycemic control for adults with diabetes: <7.0          Passed - eGFR in normal range and within 360 days    GFR calc Af Amer  Date Value Ref Range Status  09/28/2016 >60 >60 mL/min Final    Comment:    (NOTE) The eGFR has been calculated using the CKD EPI equation. This calculation has not been validated in all clinical situations. eGFR's persistently <60 mL/min signify possible Chronic Kidney Disease.    GFR, Estimated  Date Value Ref Range Status  01/23/2023 >60 >60 mL/min Final    Comment:    (NOTE) Calculated using the CKD-EPI Creatinine Equation (2021)    eGFR  Date Value Ref Range Status  09/20/2023 69 >59 mL/min/1.73 Final         Passed - Valid encounter within last 6 months    Recent Outpatient Visits           5 months ago Annual physical exam   [facility], Amy J, NP   7 months ago Acute cough   [facility], MD   8 months ago Encounter to establish care   [facility] [place], NP       Future Appointments             In 2 months Santo, Stanly LABOR, MD [facility] [employer]

## 2024-03-04 NOTE — Telephone Encounter (Signed)
 Copied from CRM [id number]. Topic: Clinical - Prescription Issue >> Mar 04, 2024  1:54 PM Rosaria E wrote: Reason for CRM: Pt called to report that her PCP checked the box that her Jardiance  is not helping her. Pt believes this was an accident and that her PCP needs to change this in order to have her insurance cover it. Needs it resent stating that the medication is indeed helping her. Please advise   Pt would like a call back from the clinic Best contact: [phone]

## 2024-03-05 ENCOUNTER — Other Ambulatory Visit: Payer: Self-pay | Admitting: Family

## 2024-03-05 DIAGNOSIS — E1165 Type 2 diabetes mellitus with hyperglycemia: Secondary | ICD-10-CM

## 2024-03-05 MED ORDER — JARDIANCE 10 MG PO TABS: 10.0000 mg | ORAL_TABLET | Freq: Every day | ORAL | 0 refills | Status: DC

## 2024-03-05 NOTE — Telephone Encounter (Signed)
 [name] with [facility] [phone] called stating that prior authorization was approved for Jardiance  for one year- reference number is [id number]

## 2024-03-05 NOTE — Telephone Encounter (Signed)
 I did not check a box indicating that Empagliflozin  is not helping patient. Patient's last office visit 09/20/2023 and Empagliflozin  prescribed at that time. Also, Empagliflozin  prescribed (03/05/2024  9:28 AM EDT). Schedule follow-up appointment.

## 2024-03-07 ENCOUNTER — Ambulatory Visit (INDEPENDENT_AMBULATORY_CARE_PROVIDER_SITE_OTHER): Admitting: "Endocrinology

## 2024-03-07 ENCOUNTER — Encounter: Payer: Self-pay | Admitting: "Endocrinology

## 2024-03-07 VITALS — BP 122/80 | HR 82 | Ht 69.0 in | Wt 198.0 lb

## 2024-03-07 DIAGNOSIS — E042 Nontoxic multinodular goiter: Secondary | ICD-10-CM | POA: Diagnosis not present

## 2024-03-07 NOTE — Progress Notes (Signed)
 Outpatient Endocrinology Note [name], MD  03/07/24   [name] 1961/01/18 [id number]  Referring Provider: Lorren Greig PARAS, NP Primary Care Provider: Lorren Greig PARAS, NP Subjective  No chief complaint on file.   Assessment & Plan  Diagnoses and all orders for this visit:  Multinodular goiter -     TSH + free T4  Multiple thyroid  nodules   [name] is currently not taking any thyroid  medication. Patient is currently biochemically euthyroid.  Educated on thyroid  axis.  Recommend the following: annual thyroid  lab  Repeat lab before next visit or sooner if symptoms of hyperthyroidism or hypothyroidism develop.  Notify us  immediately in case of pregnancy/breastfeeding or significant weight gain or loss. Counseled on compliance and follow up needs.  Thyroid  U/S in 12/19/22 at [facility] reported multinodular goiter, no images available   S/p FNA Left Thyroid  biopsy (#of nodules= 2) -Left Mid Thyroid  nodule (#3) -Left Lower Thyroid  Nodule (#4)   01/17/23 Thyroid , Left Mid Lobe; FNA (smears, ThinPrep): Findings consistent with a benign hyperplastic nodule (multinodular goiter). Bethesda Category II.  Thyroid , Left Lower Lobe; FNA (smears, ThinPrep): Findings consistent with a benign hyperplastic nodule (multinodular goiter). Bethesda Category II.  Ordered f/u thyroid  U/S: nodules are stable Recommend f/u thyroid  U/S in 1 year   I have reviewed current medications, nurse's notes, allergies, vital signs, past medical and surgical history, family medical history, and social history for this encounter. Counseled patient on symptoms, examination findings, lab findings, imaging results, treatment decisions and monitoring and prognosis. The patient understood the recommendations and agrees with the treatment plan. All questions regarding treatment plan were fully answered.   Return in about 1 year (around 03/07/2025).   [name], MD  03/07/24   I have  reviewed current medications, nurse's notes, allergies, vital signs, past medical and surgical history, family medical history, and social history for this encounter. Counseled patient on symptoms, examination findings, lab findings, imaging results, treatment decisions and monitoring and prognosis. The patient understood the recommendations and agrees with the treatment plan. All questions regarding treatment plan were fully answered.   History of Present Illness [name] is a 63 y.o. year old female who presents to our clinic with thyroid  nodules diagnosed in 2024.    Symptoms suggestive of HYPOTHYROIDISM:  fatigue No weight gain No cold intolerance  No constipation  No  Symptoms suggestive of HYPERTHYROIDISM:  weight loss  No heat intolerance No hyperdefecation  No palpitations  No  Compressive symptoms:  dysphagia  No dysphonia  No positional dyspnea (especially with simultaneous arms elevation)  No  Smokes  Yes On biotin  No Personal history of head/neck surgery/irradiation  No  Planned Procedure: Left Thyroid  biopsy (#of nodules= 2) -Left Mid Thyroid  nodule (#3) -Left Lower Thyroid  Nodule (#4)   01/17/23 Thyroid , Left Mid Lobe; FNA (smears, ThinPrep): Findings consistent with a benign hyperplastic nodule (multinodular goiter). Bethesda Category II.  Thyroid , Left Lower Lobe; FNA (smears, ThinPrep): Findings consistent with a benign hyperplastic nodule (multinodular goiter). Bethesda Category II.   12/19/22 THYROID  ULTRASOUND   INDICATION: Nontoxic uninodular goiter   COMPARISON: None   TECHNIQUE: Gray-scale and color Doppler images of the thyroid  gland were obtained.   FINDINGS:   ISTHMUS:  - Size: 0.5 cm.   RIGHT LOBE:  - Size: 5.2 x 1.9 x 2.1 cm.  - Echogenicity: Heterogeneous.SABRA   LEFT LOBE:  - Size: 4.6 x 1.8 x 2.2 cm.  - Echogenicity: Heterogeneous.   NODULES:   -  Nodule 1:  -- Size: 1.3 x 0.8 x 0.9 cm (Nestle x AP x trans).  -- Location:  Right mid.   -- Composition: solid or almost completely solid (2)  -- Echogenicity: hypoechoic (2)  -- Shape: wider-than-tall (0)  -- Margins: smooth (0)  -- Echogenic foci: none (0)   -- ACR TI-RADS total points and risk category: 4 Points - TR4.   - Nodule 2:  -- Size: 1.0 x 0.5 x 0.8 cm (Depaoli x AP x trans).  -- Location: Left upper.   -- Composition: solid or almost completely solid (2)  -- Echogenicity: hypoechoic (2)  -- Shape: wider-than-tall (0)  -- Margins: smooth (0)  -- Echogenic foci: none (0)   -- ACR TI-RADS total points and risk category: 4 Points - TR4.   - Nodule 3:  -- Size: 1.6 x 1.4 x 1.5 cm (Lemay x AP x trans).  -- Location: Left mid.   -- Composition: solid or almost completely solid (2)  -- Echogenicity: hypoechoic (2)  -- Shape: wider-than-tall (0)  -- Margins: smooth (0)  -- Echogenic foci: none (0)   -- ACR TI-RADS total points and risk category: 4 Points - TR4.   - Nodule 4:  -- Size: 2.2 x 0.9 x 1.6 cm (Wurm x AP x trans).  -- Location: Left lower.   -- Composition: solid or almost completely solid (2)  -- Echogenicity: hypoechoic (2)  -- Shape: wider-than-tall (0)  -- Margins: smooth (0)  -- Echogenic foci: none (0)   -- ACR TI-RADS total points and risk category: 4 Points - TR4.   IMPRESSION: Multiple thyroid  nodules. Largest nodules, #3 and #4 on the left meet criteria for FNA.  1.  Nodule 1: ACR TI-RADS 4: Recommendation: Follow-up ultrasound in 1 year.  2.  Nodule 2: ACR TI-RADS 4: Recommendation: Follow-up ultrasound in 1 year.  3.  Nodule 3: ACR TI-RADS 4: Recommendation: Ultrasound-guided fine needle aspiration.  4.  Nodule 4: ACR TI-RADS 4: Recommendation: Ultrasound-guided fine needle aspiration.    Physical Exam  BP 122/80   Pulse 82   Ht 5' 9 (1.753 m)   Wt 198 lb (89.8 kg)   SpO2 98%   BMI 29.24 kg/m  Constitutional: well developed, well nourished Head: normocephalic, atraumatic, no exophthalmos Eyes: sclera  anicteric, no redness Neck: + thyromegaly, no thyroid  tenderness; no nodules palpated Lungs: normal respiratory effort Neurology: alert and oriented, + fine hand tremor Skin: dry, no appreciable rashes Musculoskeletal: no appreciable defects Psychiatric: normal mood and affect  Allergies Allergies  Allergen Reactions   Sulfa Antibiotics Rash    Current Medications Patient's Medications  New Prescriptions   No medications on file  Previous Medications   AUSTEDO XR 12 MG TB24    Take 1 tablet by mouth daily.   CLONAZEPAM  (KLONOPIN ) 2 MG TABLET    Take 1 tablet by mouth at bedtime.   EPINEPHRINE  0.3 MG/0.3 ML IJ SOAJ INJECTION    Inject 0.3 mg into the muscle as needed for anaphylaxis.   HYDROCODONE -ACETAMINOPHEN  (NORCO/VICODIN) 5-325 MG TABLET    Take 1 tablet by mouth every 4 (four) hours as needed.   IBUPROFEN  (ADVIL ,MOTRIN ) 200 MG TABLET    Take 200 mg by mouth every 6 (six) hours as needed.   JARDIANCE  10 MG TABS TABLET    Take 1 tablet (10 mg total) by mouth daily.   KETOROLAC  (TORADOL ) 10 MG TABLET    Take 1 tablet (10 mg total) by mouth every 6 (six)  hours as needed.   LOSARTAN -HYDROCHLOROTHIAZIDE (HYZAAR) 50-12.5 MG TABLET    TAKE 1 TABLET BY MOUTH EVERY DAY   LURASIDONE (LATUDA) 40 MG TABS TABLET    Take 40 mg by mouth daily with breakfast.   METFORMIN  (GLUCOPHAGE -XR) 500 MG 24 HR TABLET    TAKE 1 TABLET (500 MG TOTAL) BY MOUTH IN THE MORNING AND AT BEDTIME   METHOCARBAMOL  (ROBAXIN ) 500 MG TABLET    Take 1 tablet (500 mg total) by mouth 2 (two) times daily.   PAROXETINE  (PAXIL ) 40 MG TABLET    Take 40 mg by mouth every morning.    ROSUVASTATIN  (CRESTOR ) 10 MG TABLET    TAKE 1 TABLET BY MOUTH EVERY DAY  Modified Medications   No medications on file  Discontinued Medications   No medications on file    Past Medical History Past Medical History:  Diagnosis Date   Anxiety disorder    Bipolar 1 disorder (HCC)    w/ hx suicide attempt age 69 and 83   Complication of  anesthesia    slow to wake   Diabetes mellitus without complication (HCC)    History of kidney stones    History of melanoma excision    2004-- BACK   Hyperlipidemia    Hypertension    OA (osteoarthritis)    KNEE   Renal disorder    Retained ureteral stent    Urgency of urination    Wears glasses     Past Surgical History Past Surgical History:  Procedure Laterality Date   CARPAL TUNNEL RELEASE Right 1996   CESAREAN SECTION  1992   CYSTOSCOPY WITH URETEROSCOPY Left 10/19/2016   Procedure: CYSTOSCOPY WITH LEFT  URETEROSCOPY WITH URETERAL STENT REMOVAL;  Surgeon: Morene LELON Salines, MD;  [facility];  Service: Urology;  Laterality: Left;   LUMBAR MICRODISCECTOMY  1995   L4 -- L5   NEPHROLITHOTOMY Left 09/27/2016   Procedure: NEPHROLITHOTOMY PERCUTANEOUS WITH SURGEON ACCESS;  Surgeon: Morene LELON Salines, MD;  Location: [facility];  Service: Urology;  Laterality: Left;   RECONSTRUCTION OF CORACOCLAVICULAR LIGAMENT Left 01/27/2023   Procedure: RECONSTRUCTION OF ACROMIOCLAVICULAR WITH HAMSTRING ALLOGRAFT SEMI-T;  Surgeon: Sharl Selinda Dover, MD;  Location: [facility];  Service: Orthopedics;  Laterality: Left;  120   SHOULDER ARTHROSCOPY Left 01/27/2023   Procedure: ARTHROSCOPY SHOULDER;  Surgeon: Sharl Selinda Dover, MD;  Location: [facility];  Service: Orthopedics;  Laterality: Left;  120   TOTAL ABDOMINAL HYSTERECTOMY W/ BILATERAL SALPINGOOPHORECTOMY  02/06/2009   TUBAL LIGATION Bilateral 1995    Family History family history includes COPD in her maternal grandfather and mother; Emphysema in her maternal grandfather and mother; Lung cancer in her mother; Mesothelioma in her father.  Social History Social History   Socioeconomic History   Marital status: Single    Spouse name: Not on file   Number of children: Not on file   Years of education: Not on file   Highest education level: Associate degree: academic program   Occupational History   Not on file  Tobacco Use   Smoking status: Every Day    Current packs/day: 0.50    Average packs/day: 1 pack/day for 46.6 years (45.3 ttl pk-yrs)    Types: Cigarettes    Start date: 1979   Smokeless tobacco: Never  Vaping Use   Vaping status: Never Used  Substance and Sexual Activity   Alcohol use: Yes    Comment: occasionally   Drug use: No  Sexual activity: Not on file  Other Topics Concern   Not on file  Social History Narrative   Not on file   Social Drivers of Health   Financial Resource Strain: Low Risk  (06/22/2023)   Overall Financial Resource Strain (CARDIA)    Difficulty of Paying Living Expenses: Not very hard  Food Insecurity: Food Insecurity Present (06/22/2023)   Hunger Vital Sign    Worried About Running Out of Food in the Last Year: Sometimes true    Ran Out of Food in the Last Year: Sometimes true  Transportation Needs: No Transportation Needs (06/22/2023)   PRAPARE - Administrator, Civil Service (Medical): No    Lack of Transportation (Non-Medical): No  Physical Activity: Unknown (06/22/2023)   Exercise Vital Sign    Days of Exercise per Week: 0 days    Minutes of Exercise per Session: Not on file  Stress: No Stress Concern Present (06/22/2023)   [employer] of Occupational Health - Occupational Stress Questionnaire    Feeling of Stress : Only a little  Social Connections: Socially Isolated (06/22/2023)   Social Connection and Isolation Panel    Frequency of Communication with Friends and Family: More than three times a week    Frequency of Social Gatherings with Friends and Family: Twice a week    Attends Religious Services: Never    Database administrator or Organizations: No    Attends Engineer, structural: Not on file    Marital Status: Divorced  Intimate Partner Violence: Unknown (01/25/2022)   Received from [facility]   HITS    Physically Hurt: Not on file    Insult or Talk Down To: Not  on file    Threaten Physical Harm: Not on file    Scream or Curse: Not on file    Laboratory Investigations Lab Results  Component Value Date   TSH 1.710 09/20/2023   TSH  02/19/2008    2.141 (NOTE)  Please note change in reference range(s). Test methodology is 3rd generation TSH   FREET4 1.01 02/19/2008     No results found for: TSI   No components found for: TRAB   Lab Results  Component Value Date   CHOL 143 09/20/2023   Lab Results  Component Value Date   HDL 39 (L) 09/20/2023   Lab Results  Component Value Date   LDLCALC 70 09/20/2023   Lab Results  Component Value Date   TRIG 206 (H) 09/20/2023   Lab Results  Component Value Date   CHOLHDL 3.7 09/20/2023   Lab Results  Component Value Date   CREATININE 0.95 09/20/2023   No results found for: GFR    Component Value Date/Time   NA 145 (H) 09/20/2023 0948   K 3.7 09/20/2023 0948   CL 103 09/20/2023 0948   CO2 26 09/20/2023 0948   GLUCOSE 123 (H) 09/20/2023 0948   GLUCOSE 156 (H) 01/23/2023 1318   BUN 16 09/20/2023 0948   CREATININE 0.95 09/20/2023 0948   CALCIUM  10.0 09/20/2023 0948   PROT 7.2 09/20/2023 0948   ALBUMIN 4.8 09/20/2023 0948   AST 22 09/20/2023 0948   ALT 19 09/20/2023 0948   ALKPHOS 117 09/20/2023 0948   BILITOT 0.5 09/20/2023 0948   GFRNONAA >60 01/23/2023 1318   GFRAA >60 09/28/2016 0659      Latest Ref Rng & Units 09/20/2023    9:48 AM 06/26/2023    8:43 AM 01/23/2023    1:18 PM  BMP  Glucose 70 - 99 mg/dL 876  882  843   BUN 8 - 27 mg/dL 16  19  13    Creatinine 0.57 - 1.00 mg/dL 9.04  9.02  9.09   BUN/Creat Ratio 12 - 28 17  20     Sodium 134 - 144 mmol/L 145  144  142   Potassium 3.5 - 5.2 mmol/L 3.7  3.8  3.5   Chloride 96 - 106 mmol/L 103  101  100   CO2 20 - 29 mmol/L 26  26  26    Calcium  8.7 - 10.3 mg/dL 89.9  9.4  9.1        Component Value Date/Time   WBC 8.3 09/20/2023 0948   WBC 8.2 09/26/2016 0906   RBC 5.27 09/20/2023 0948   RBC 4.91 09/26/2016 0906    HGB 16.4 (H) 09/20/2023 0948   HCT 47.1 (H) 09/20/2023 0948   PLT 229 09/20/2023 0948   MCV 89 09/20/2023 0948   MCH 31.1 09/20/2023 0948   MCH 30.3 09/26/2016 0906   MCHC 34.8 09/20/2023 0948   MCHC 35.6 09/26/2016 0906   RDW 13.1 09/20/2023 0948   LYMPHSABS 3.5 02/17/2008 2318   MONOABS 0.4 02/17/2008 2318   EOSABS 0.0 02/17/2008 2318   BASOSABS 0.0 02/17/2008 2318      Parts of this note may have been dictated using voice recognition software. There may be variances in spelling and vocabulary which are unintentional. Not all errors are proofread. Please notify the dino if any discrepancies are noted or if the meaning of any statement is not clear.

## 2024-03-12 ENCOUNTER — Ambulatory Visit (INDEPENDENT_AMBULATORY_CARE_PROVIDER_SITE_OTHER): Admitting: Family

## 2024-03-12 ENCOUNTER — Encounter: Payer: Self-pay | Admitting: Family

## 2024-03-12 VITALS — BP 105/68 | HR 77 | Temp 98.3°F | Resp 16 | Ht 69.0 in | Wt 195.0 lb

## 2024-03-12 DIAGNOSIS — E1165 Type 2 diabetes mellitus with hyperglycemia: Secondary | ICD-10-CM | POA: Diagnosis not present

## 2024-03-12 DIAGNOSIS — Z7984 Long term (current) use of oral hypoglycemic drugs: Secondary | ICD-10-CM

## 2024-03-12 DIAGNOSIS — I10 Essential (primary) hypertension: Secondary | ICD-10-CM

## 2024-03-12 DIAGNOSIS — Z13820 Encounter for screening for osteoporosis: Secondary | ICD-10-CM

## 2024-03-12 DIAGNOSIS — E785 Hyperlipidemia, unspecified: Secondary | ICD-10-CM

## 2024-03-12 MED ORDER — METFORMIN HCL ER 500 MG PO TB24: 500.0000 mg | ORAL_TABLET | Freq: Two times a day (BID) | ORAL | 0 refills | Status: DC

## 2024-03-12 MED ORDER — ROSUVASTATIN CALCIUM 10 MG PO TABS: 10.0000 mg | ORAL_TABLET | Freq: Every day | ORAL | 0 refills | Status: DC

## 2024-03-12 MED ORDER — LOSARTAN POTASSIUM-HCTZ 50-12.5 MG PO TABS: 1.0000 | ORAL_TABLET | Freq: Every day | ORAL | 0 refills | Status: DC

## 2024-03-12 MED ORDER — JARDIANCE 10 MG PO TABS: 10.0000 mg | ORAL_TABLET | Freq: Every day | ORAL | 0 refills | Status: DC

## 2024-03-12 NOTE — Progress Notes (Signed)
 Patient ID: [name], female    DOB: 1961/04/06  MRN: [id number]  CC: Chronic Conditions Follow-Up  Subjective: [name] is a 63 y.o. female who presents for chronic conditions follow-up.   Her concerns today include:  - Doing well on Losartan -Hydrochlorothiazide, no issues/concerns. She does not complain of red flag symptoms such as but not limited to chest pain, shortness of breath, worst headache of life, nausea/vomiting.  - Doing well on Metformin  XR and Jardiance , no issues/concerns. Denies red flag symptoms associated with diabetes. - Doing well on Rosuvastatin , no issues/concerns.  - Routine DEXA screening. - Patient states she does not want to discuss weight management on today.   Patient Active Problem List   Diagnosis Date Noted   Renal stone 09/27/2016     Current Outpatient Medications on File Prior to Visit  Medication Sig Dispense Refill   AUSTEDO XR 12 MG TB24 Take 1 tablet by mouth daily.     clonazePAM  (KLONOPIN ) 2 MG tablet Take 1 tablet by mouth at bedtime.     EPINEPHrine  0.3 mg/0.3 mL IJ SOAJ injection Inject 0.3 mg into the muscle as needed for anaphylaxis. 2 each 2   ibuprofen  (ADVIL ,MOTRIN ) 200 MG tablet Take 200 mg by mouth every 6 (six) hours as needed.     lurasidone (LATUDA) 40 MG TABS tablet Take 40 mg by mouth daily with breakfast.     PARoxetine  (PAXIL ) 40 MG tablet Take 40 mg by mouth every morning.      HYDROcodone -acetaminophen  (NORCO/VICODIN) 5-325 MG tablet Take 1 tablet by mouth every 4 (four) hours as needed. 15 tablet 0   ketorolac  (TORADOL ) 10 MG tablet Take 1 tablet (10 mg total) by mouth every 6 (six) hours as needed. (Patient not taking: Reported on 03/12/2024) 20 tablet 0   methocarbamol  (ROBAXIN ) 500 MG tablet Take 1 tablet (500 mg total) by mouth 2 (two) times daily. (Patient not taking: Reported on 03/12/2024) 20 tablet 0   No current facility-administered medications on file prior to visit.    Allergies  Allergen Reactions    Sulfa Antibiotics Rash    Social History   Socioeconomic History   Marital status: Single    Spouse name: Not on file   Number of children: Not on file   Years of education: Not on file   Highest education level: Associate degree: academic program  Occupational History   Not on file  Tobacco Use   Smoking status: Every Day    Current packs/day: 0.50    Average packs/day: 1 pack/day for 46.6 years (45.3 ttl pk-yrs)    Types: Cigarettes    Start date: 1979   Smokeless tobacco: Never  Vaping Use   Vaping status: Never Used  Substance and Sexual Activity   Alcohol use: Yes    Comment: occasionally   Drug use: No   Sexual activity: Not on file  Other Topics Concern   Not on file  Social History Narrative   Not on file   Social Drivers of Health   Financial Resource Strain: Low Risk  (06/22/2023)   Overall Financial Resource Strain (CARDIA)    Difficulty of Paying Living Expenses: Not very hard  Food Insecurity: Food Insecurity Present (06/22/2023)   Hunger Vital Sign    Worried About Running Out of Food in the Last Year: Sometimes true    Ran Out of Food in the Last Year: Sometimes true  Transportation Needs: No Transportation Needs (06/22/2023)   PRAPARE - Transportation  Lack of Transportation (Medical): No    Lack of Transportation (Non-Medical): No  Physical Activity: Unknown (06/22/2023)   Exercise Vital Sign    Days of Exercise per Week: 0 days    Minutes of Exercise per Session: Not on file  Stress: No Stress Concern Present (06/22/2023)   [employer] of Occupational Health - Occupational Stress Questionnaire    Feeling of Stress : Only a little  Social Connections: Socially Isolated (06/22/2023)   Social Connection and Isolation Panel    Frequency of Communication with Friends and Family: More than three times a week    Frequency of Social Gatherings with Friends and Family: Twice a week    Attends Religious Services: Never    Database administrator  or Organizations: No    Attends Engineer, structural: Not on file    Marital Status: Divorced  Intimate Partner Violence: Unknown (01/25/2022)   Received from [facility]   HITS    Physically Hurt: Not on file    Insult or Talk Down To: Not on file    Threaten Physical Harm: Not on file    Scream or Curse: Not on file    Family History  Problem Relation Age of Onset   COPD Mother    Emphysema Mother    Lung cancer Mother        smoked   Mesothelioma Father        smoked   Emphysema Maternal Grandfather    COPD Maternal Grandfather     Past Surgical History:  Procedure Laterality Date   CARPAL TUNNEL RELEASE Right 1996   CESAREAN SECTION  1992   CLAVICLE SURGERY Right 11/2023   CYSTOSCOPY WITH URETEROSCOPY Left 10/19/2016   Procedure: CYSTOSCOPY WITH LEFT  URETEROSCOPY WITH URETERAL STENT REMOVAL;  Surgeon: Morene LELON Salines, MD;  Location: [facility];  Service: Urology;  Laterality: Left;   LUMBAR MICRODISCECTOMY  1995   L4 -- L5   NEPHROLITHOTOMY Left 09/27/2016   Procedure: NEPHROLITHOTOMY PERCUTANEOUS WITH SURGEON ACCESS;  Surgeon: Morene LELON Salines, MD;  Location: [facility];  Service: Urology;  Laterality: Left;   RECONSTRUCTION OF CORACOCLAVICULAR LIGAMENT Left 01/27/2023   Procedure: RECONSTRUCTION OF ACROMIOCLAVICULAR WITH HAMSTRING ALLOGRAFT SEMI-T;  Surgeon: Sharl Selinda Dover, MD;  Location: [facility];  Service: Orthopedics;  Laterality: Left;  120   SHOULDER ARTHROSCOPY Left 01/27/2023   Procedure: ARTHROSCOPY SHOULDER;  Surgeon: Sharl Selinda Dover, MD;  Location: [facility];  Service: Orthopedics;  Laterality: Left;  120   TOTAL ABDOMINAL HYSTERECTOMY W/ BILATERAL SALPINGOOPHORECTOMY  02/06/2009   TUBAL LIGATION Bilateral 1995    ROS: Review of Systems Negative except as stated above  PHYSICAL EXAM: BP 105/68 (BP Location: Left Arm, Patient Position: Sitting, Cuff Size: Large)   Pulse 77   Temp  98.3 F (36.8 C) (Oral)   Resp 16   Ht 5' 9 (1.753 m)   Wt 195 lb (88.5 kg)   SpO2 96%   BMI 28.80 kg/m   Physical Exam HENT:     Head: Normocephalic and atraumatic.     Nose: Nose normal.     Mouth/Throat:     Mouth: Mucous membranes are moist.     Pharynx: Oropharynx is clear.  Eyes:     Extraocular Movements: Extraocular movements intact.     Conjunctiva/sclera: Conjunctivae normal.     Pupils: Pupils are equal, round, and reactive to light.  Cardiovascular:     Rate and Rhythm:  Normal rate and regular rhythm.     Pulses: Normal pulses.     Heart sounds: Normal heart sounds.  Pulmonary:     Effort: Pulmonary effort is normal.     Breath sounds: Normal breath sounds.  Musculoskeletal:        General: Normal range of motion.     Cervical back: Normal range of motion and neck supple.  Neurological:     General: No focal deficit present.     Mental Status: She is alert and oriented to person, place, and time.  Psychiatric:        Mood and Affect: Mood normal.        Behavior: Behavior normal.     ASSESSMENT AND PLAN: 1. Primary hypertension (Primary) - Continue Losartan -Hydrochlorothiazide as prescribed.  - Routine screening.  - Counseled on blood pressure goal of less than 130/80, low-sodium, DASH diet, medication compliance, and 150 minutes of moderate intensity exercise per week as tolerated. Counseled on medication adherence and adverse effects. - Follow-up with primary provider in 3 months or sooner if needed. - losartan -hydrochlorothiazide (HYZAAR) 50-12.5 MG tablet; Take 1 tablet by mouth daily.  Dispense: 90 tablet; Refill: 0 - Basic Metabolic Panel  2. Type 2 diabetes mellitus with hyperglycemia, without Fenstermacher-term current use of insulin (HCC) - Continue Metformin  XR and Jardiance  as prescribed.  - Hemoglobin A1c result pending.  - Discussed the importance of healthy eating habits, low-carbohydrate diet, low-sugar diet, regular aerobic exercise (at least 150  minutes a week as tolerated) and medication compliance to achieve or maintain control of diabetes. Counseled on medication adherence/adverse effects. - Follow-up with primary provider as scheduled. - metFORMIN  (GLUCOPHAGE -XR) 500 MG 24 hr tablet; Take 1 tablet (500 mg total) by mouth in the morning and at bedtime.  Dispense: 180 tablet; Refill: 0 - JARDIANCE  10 MG TABS tablet; Take 1 tablet (10 mg total) by mouth daily.  Dispense: 90 tablet; Refill: 0 - Hemoglobin A1c  3. Hyperlipidemia, unspecified hyperlipidemia type - Continue Rosuvastatin  as prescribed. Counseled on medication adherence/adverse effects.  - Follow-up with primary provider in 3 months or sooner if needed. - rosuvastatin  (CRESTOR ) 10 MG tablet; Take 1 tablet (10 mg total) by mouth daily.  Dispense: 90 tablet; Refill: 0  4. Osteoporosis screening - Routine screening.  - DG Bone Density; Future    Patient was given the opportunity to ask questions.  Patient verbalized understanding of the plan and was able to repeat key elements of the plan. Patient was given clear instructions to go to Emergency Department or return to medical center if symptoms don't improve, worsen, or new problems develop.The patient verbalized understanding.   Orders Placed This Encounter  Procedures   DG Bone Density   Hemoglobin A1c   Basic Metabolic Panel     Requested Prescriptions   Signed Prescriptions Disp Refills   losartan -hydrochlorothiazide (HYZAAR) 50-12.5 MG tablet 90 tablet 0    Sig: Take 1 tablet by mouth daily.   metFORMIN  (GLUCOPHAGE -XR) 500 MG 24 hr tablet 180 tablet 0    Sig: Take 1 tablet (500 mg total) by mouth in the morning and at bedtime.   rosuvastatin  (CRESTOR ) 10 MG tablet 90 tablet 0    Sig: Take 1 tablet (10 mg total) by mouth daily.   JARDIANCE  10 MG TABS tablet 90 tablet 0    Sig: Take 1 tablet (10 mg total) by mouth daily.    Return in about 3 months (around 06/12/2024) for Follow-Up or next available  chronic  conditions.  Greig JINNY Drones, NP

## 2024-03-13 ENCOUNTER — Ambulatory Visit: Payer: Self-pay | Admitting: Family

## 2024-03-13 ENCOUNTER — Other Ambulatory Visit: Payer: Self-pay

## 2024-03-13 LAB — BASIC METABOLIC PANEL WITH GFR
BUN/Creatinine Ratio: 19 (ref 12–28)
BUN: 18 mg/dL (ref 8–27)
CO2: 22 mmol/L (ref 20–29)
Calcium: 9.7 mg/dL (ref 8.7–10.3)
Chloride: 99 mmol/L (ref 96–106)
Creatinine, Ser: 0.93 mg/dL (ref 0.57–1.00)
Glucose: 167 mg/dL — ABNORMAL HIGH (ref 70–99)
Potassium: 3.6 mmol/L (ref 3.5–5.2)
Sodium: 141 mmol/L (ref 134–144)
eGFR: 70 mL/min/1.73 (ref >59)

## 2024-03-13 LAB — HEMOGLOBIN A1C
Est. average glucose Bld gHb Est-mCnc: 123 mg/dL
Hgb A1c MFr Bld: 5.9 % — ABNORMAL HIGH (ref 4.8–5.6)

## 2024-03-31 ENCOUNTER — Other Ambulatory Visit: Payer: Self-pay | Admitting: Family

## 2024-03-31 DIAGNOSIS — E1165 Type 2 diabetes mellitus with hyperglycemia: Secondary | ICD-10-CM

## 2024-04-01 NOTE — Telephone Encounter (Signed)
 Requested Prescriptions  Refused Prescriptions Disp Refills   metFORMIN  (GLUCOPHAGE -XR) 500 MG 24 hr tablet [Pharmacy Med Name: METFORMIN  HCL ER 500 MG TABLET] 180 tablet 0    Sig: TAKE 1 TABLET (500 MG TOTAL) BY MOUTH IN THE MORNING AND AT BEDTIME     Endocrinology:  Diabetes - Biguanides Failed - 04/01/2024  5:16 PM      Failed - B12 Level in normal range and within 720 days    No results found for: VITAMINB12       Failed - CBC within normal limits and completed in the last 12 months    WBC  Date Value Ref Range Status  09/20/2023 8.3 3.4 - 10.8 x10E3/uL Final  09/26/2016 8.2 4.0 - 10.5 K/uL Final   RBC  Date Value Ref Range Status  09/20/2023 5.27 3.77 - 5.28 x10E6/uL Final  09/26/2016 4.91 3.87 - 5.11 MIL/uL Final   Hemoglobin  Date Value Ref Range Status  09/20/2023 16.4 (H) 11.1 - 15.9 g/dL Final   Hematocrit  Date Value Ref Range Status  09/20/2023 47.1 (H) 34.0 - 46.6 % Final   MCHC  Date Value Ref Range Status  09/20/2023 34.8 31.5 - 35.7 g/dL Final  97/80/7981 64.3 30.0 - 36.0 g/dL Final   [facility]  Date Value Ref Range Status  09/20/2023 31.1 26.6 - 33.0 pg Final  09/26/2016 30.3 26.0 - 34.0 pg Final   MCV  Date Value Ref Range Status  09/20/2023 89 79 - 97 fL Final   No results found for: PLTCOUNTKUC, LABPLAT, POCPLA RDW  Date Value Ref Range Status  09/20/2023 13.1 11.7 - 15.4 % Final         Passed - Cr in normal range and within 360 days    Creatinine, Ser  Date Value Ref Range Status  03/12/2024 0.93 0.57 - 1.00 mg/dL Final         Passed - HBA1C is between 0 and 7.9 and within 180 days    Hgb A1c MFr Bld  Date Value Ref Range Status  03/12/2024 5.9 (H) 4.8 - 5.6 % Final    Comment:             Prediabetes: 5.7 - 6.4          Diabetes: >6.4          Glycemic control for adults with diabetes: <7.0          Passed - eGFR in normal range and within 360 days    GFR calc Af Amer  Date Value Ref Range Status  09/28/2016 >60 >60 mL/min  Final    Comment:    (NOTE) The eGFR has been calculated using the CKD EPI equation. This calculation has not been validated in all clinical situations. eGFR's persistently <60 mL/min signify possible Chronic Kidney Disease.    GFR, Estimated  Date Value Ref Range Status  01/23/2023 >60 >60 mL/min Final    Comment:    (NOTE) Calculated using the CKD-EPI Creatinine Equation (2021)    eGFR  Date Value Ref Range Status  03/12/2024 70 >59 mL/min/1.73 Final         Passed - Valid encounter within last 6 months    Recent Outpatient Visits           2 weeks ago Primary hypertension   [place] [facility], [place], NP   6 months ago Annual physical exam   [facility],  Amy J, NP   8 months ago Acute cough   [facility] [facility], MD   9 months ago Encounter to establish care   [facility], [place], NP       Future Appointments             In 1 month Chandrasekhar, Stanly LABOR, MD [facility] at [facility] [employer] [facility] [employer], [employer]   In 2 months Lorren Greig PARAS, NP [facility] at [facility]

## 2024-04-03 ENCOUNTER — Ambulatory Visit: Admitting: Internal Medicine

## 2024-04-03 DIAGNOSIS — J4489 Other specified chronic obstructive pulmonary disease: Secondary | ICD-10-CM

## 2024-04-03 DIAGNOSIS — J439 Emphysema, unspecified: Secondary | ICD-10-CM

## 2024-04-03 LAB — PULMONARY FUNCTION TEST
DL/VA % pred: 97 %
DL/VA: 3.97 ml/min/mmHg/L
DLCO cor % pred: 90 %
DLCO cor: 21.15 ml/min/mmHg
DLCO unc % pred: 90 %
DLCO unc: 21.15 ml/min/mmHg
FEF 25-75 Post: 2.26 L/s
FEF 25-75 Pre: 1.75 L/s
FEF2575-%Change-Post: 29 %
FEF2575-%Pred-Post: 86 %
FEF2575-%Pred-Pre: 66 %
FEV1-%Change-Post: 7 %
FEV1-%Pred-Post: 79 %
FEV1-%Pred-Pre: 73 %
FEV1-Post: 2.37 L
FEV1-Pre: 2.2 L
FEV1FVC-%Change-Post: 0 %
FEV1FVC-%Pred-Pre: 93 %
FEV6-%Change-Post: 7 %
FEV6-%Pred-Post: 86 %
FEV6-%Pred-Pre: 80 %
FEV6-Post: 3.23 L
FEV6-Pre: 3.01 L
FEV6FVC-%Change-Post: 0 %
FEV6FVC-%Pred-Post: 103 %
FEV6FVC-%Pred-Pre: 103 %
FVC-%Change-Post: 7 %
FVC-%Pred-Post: 83 %
FVC-%Pred-Pre: 77 %
FVC-Post: 3.24 L
FVC-Pre: 3.01 L
Post FEV1/FVC ratio: 73 %
Post FEV6/FVC ratio: 100 %
Pre FEV1/FVC ratio: 73 %
Pre FEV6/FVC Ratio: 100 %
RV % pred: 91 %
RV: 2.02 L
TLC % pred: 96 %
TLC: 5.53 L

## 2024-04-03 NOTE — Patient Instructions (Signed)
 Full PFT performed today.

## 2024-04-03 NOTE — Progress Notes (Signed)
 Full PFT performed today.

## 2024-04-09 ENCOUNTER — Ambulatory Visit: Admitting: Family

## 2024-04-09 ENCOUNTER — Other Ambulatory Visit: Payer: Self-pay

## 2024-04-09 ENCOUNTER — Ambulatory Visit: Attending: Orthopedic Surgery | Admitting: Physical Therapy

## 2024-04-09 ENCOUNTER — Encounter: Payer: Self-pay | Admitting: Physical Therapy

## 2024-04-09 DIAGNOSIS — M25511 Pain in right shoulder: Secondary | ICD-10-CM | POA: Insufficient documentation

## 2024-04-09 DIAGNOSIS — M6281 Muscle weakness (generalized): Secondary | ICD-10-CM | POA: Diagnosis present

## 2024-04-09 DIAGNOSIS — M25611 Stiffness of right shoulder, not elsewhere classified: Secondary | ICD-10-CM | POA: Insufficient documentation

## 2024-04-09 NOTE — Therapy (Signed)
 OUTPATIENT PHYSICAL THERAPY UPPER EXTREMITY EVALUATION   Patient Name: [name] MRN: [id number] DOB:02/24/1961, 63 y.o., female Today's Date: 04/09/2024  END OF SESSION:  PT End of Session - 04/09/24 1120     Visit Number 1    Number of Visits 7    Date for PT Re-Evaluation 05/21/24    PT Start Time 1040    PT Stop Time 1111    PT Time Calculation (min) 31 min    Activity Tolerance Patient tolerated treatment well    Behavior During Therapy WFL for tasks assessed/performed          Past Medical History:  Diagnosis Date   Anxiety disorder    Bipolar 1 disorder (HCC)    w/ hx suicide attempt age 82 and 1997   Complication of anesthesia    slow to wake   Diabetes mellitus without complication (HCC)    History of kidney stones    History of melanoma excision    2004-- BACK   Hyperlipidemia    Hypertension    OA (osteoarthritis)    KNEE   Renal disorder    Retained ureteral stent    Urgency of urination    Wears glasses    Past Surgical History:  Procedure Laterality Date   CARPAL TUNNEL RELEASE Right 1996   CESAREAN SECTION  1992   CLAVICLE SURGERY Right 11/2023   CYSTOSCOPY WITH URETEROSCOPY Left 10/19/2016   Procedure: CYSTOSCOPY WITH LEFT  URETEROSCOPY WITH URETERAL STENT REMOVAL;  Surgeon: Tiffany LELON Salines, MD;  Location: [facility];  Service: Urology;  Laterality: Left;   LUMBAR MICRODISCECTOMY  1995   L4 -- L5   NEPHROLITHOTOMY Left 09/27/2016   Procedure: NEPHROLITHOTOMY PERCUTANEOUS WITH SURGEON ACCESS;  Surgeon: Tiffany LELON Salines, MD;  Location: [facility];  Service: Urology;  Laterality: Left;   RECONSTRUCTION OF CORACOCLAVICULAR LIGAMENT Left 01/27/2023   Procedure: RECONSTRUCTION OF ACROMIOCLAVICULAR WITH HAMSTRING ALLOGRAFT SEMI-T;  Surgeon: Tiffany Selinda Dover, MD;  Location: [facility];  Service: Orthopedics;  Laterality: Left;  120   SHOULDER ARTHROSCOPY Left 01/27/2023   Procedure: ARTHROSCOPY SHOULDER;   Surgeon: Tiffany Selinda Dover, MD;  Location:  SURGERY CENTER;  Service: Orthopedics;  Laterality: Left;  120   TOTAL ABDOMINAL HYSTERECTOMY W/ BILATERAL SALPINGOOPHORECTOMY  02/06/2009   TUBAL LIGATION Bilateral 1995   Patient Active Problem List   Diagnosis Date Noted   Renal stone 09/27/2016    PCP: Tiffany Greig PARAS, NP  REFERRING PROVIDER: Dozier Soulier, MD  REFERRING DIAG: S/P RIGHT CLAVICLE FRACTURE   Rationale for Evaluation and Treatment: Rehabilitation  THERAPY DIAG:  Right shoulder pain, unspecified chronicity  Stiffness of right shoulder, not elsewhere classified  Muscle weakness (generalized)  PERTINENT HISTORY: COPD, HTN, dm2  WEIGHT BEARING RESTRICTIONS: No  FALLS:  Has patient fallen in last 6 months? Yes. Number of falls July 4th fall that fx clavicle  LIVING ENVIRONMENT: Lives with: lives with their family Lives in: House/apartment Stairs: unrelated/ Has following equipment at home: None  OCCUPATION: not currently working   PRECAUTIONS: None ---------------------------------------------------------------------------------------------  SUBJECTIVE:  SUBJECTIVE STATEMENT: Eval statement 9/2/2025BETHA Moody on July 4th into gravel, associated the fall to being inebriated during celebratory festivities. Required ORIF of Clavicle, chart shows 7/06/205, pt reports 02/13/2024. Doesn't feel like she looses her grip, is able to do most activities aside from grasping motions d/t thumb. Activities such as opening a soda or a jar. 0/10 pain in the shoulder, some n/t into the lateral forearm and thumb   Hand dominance: Right  RED FLAGS: None   PLOF: Independent  PATIENT GOALS: get feeling back in the arm and thumb  NEXT MD VISIT: 24th of September,  2025 ---------------------------------------------------------------------------------------------  OBJECTIVE:  Note: Objective measures were completed at Evaluation unless otherwise noted.  DIAGNOSTIC FINDINGS:  Moderate AC joint degenerative change. No fracture or malalignment at the glenohumeral interval. Acute comminuted and displaced right midclavicular fracture   IMPRESSION: Acute comminuted and displaced right midclavicular fracture.     Electronically Signed   By: [name] M.D.   On: 02/09/2024 20:23  PATIENT SURVEYS :  Quick dash: 31 (45.5%)  COGNITION: Overall cognitive status: Within functional limits for tasks assessed     SENSATION: Light touch: Impaired   POSTURE: Thoracic kyphosis, forward head.  UPPER EXTREMITY ROM:   Active ROM Right eval Left eval  Shoulder flexion 170 170  Shoulder extension 60 60  Shoulder abduction 110 180  Shoulder adduction    Shoulder internal rotation 70 70  Shoulder external rotation 80 80  Elbow flexion    Elbow extension    Wrist flexion    Wrist extension    Wrist ulnar deviation    Wrist radial deviation    Wrist pronation    Wrist supination    (Blank rows = not tested)  UPPER EXTREMITY MMT:  MMT Right eval Left eval  Shoulder flexion 4 4+  Shoulder extension 5 5  Shoulder abduction 4 4+  Shoulder adduction    Shoulder internal rotation    Shoulder external rotation 4 4+  Middle trapezius    Lower trapezius    Elbow flexion    Elbow extension    Wrist flexion    Wrist extension    Wrist ulnar deviation    Wrist radial deviation    Wrist pronation    Wrist supination    Grip strength (lbs) 58lbs 65lbs  (Blank rows = not tested)  JOINT MOBILITY TESTING:  deferred  PALPATION:  Slight TTP to fx line                                             [facility] Adult PT Treatment:                                                DATE: 04/09/2024 Self Care: Pt education  POC  PATIENT  EDUCATION: Education details: Pt received education regarding HEP performance, ADL performance, functional activity tolerance, impairment education, appropriate performance of therapeutic activities. Person educated: Patient Education method: Explanation, Demonstration, Tactile cues, Verbal cues, and Handouts Education comprehension: verbalized understanding and returned demonstration  HOME EXERCISE PROGRAM: Access Code: [email or web address] URL: [email or web address] Date: 04/09/2024 Prepared by: [name]  Exercises - Standing Shoulder Abduction AAROM with Dowel  - 1-3 x daily - 7 x weekly - 2 sets -  15 reps - Standing Row with Anchored Resistance  - 1 x daily - 4 x weekly - 2-3 sets - 12 reps - 2s hold - Standing Shoulder Horizontal Abduction with Resistance  - 1 x daily - 4 x weekly - 2-3 sets - 12 reps - 2s hold ---------------------------------------------------------------------------------------------  ASSESSMENT:  CLINICAL IMPRESSION: Eval impression (04/09/2024): Pt. attended today's physical therapy session for evaluation of R clavicle fx. Pt has complaints of n/t in R thumb as well as some pain during activity, up to 3/10, currently 0/10 at rest. Pt has notable deficits and would benefit from therapeutic focus on R shoulder AROM, strength, and activity tolerance. Pt also demonstrates a level of radial nerve entrapment with R grip strength deficits, s/s show entrapment at the supinator. Treatment performed today focused on pt education. Pt demonstrated great understanding of education provided. required minimal v/t cues and no assistance for appropriate performance with today's activities.  Pt requires the intervention of skilled outpatient physical therapy to address the aforementioned deficits and progress towards a functional level in line with therapeutic goals.    OBJECTIVE IMPAIRMENTS: decreased mobility, decreased ROM, decreased strength, hypomobility, impaired  sensation, impaired tone, improper body mechanics, postural dysfunction, and pain.   ACTIVITY LIMITATIONS: carrying, lifting, toileting, and reach over head  PARTICIPATION LIMITATIONS: meal prep, cleaning, driving, and community activity  PERSONAL FACTORS: Age, Behavior pattern, Fitness, Past/current experiences, and Time since onset of injury/illness/exacerbation are also affecting patient's functional outcome.   REHAB POTENTIAL: Good  CLINICAL DECISION MAKING: Stable/uncomplicated  EVALUATION COMPLEXITY: Low  GOALS: Goals reviewed with patient? Yes  SHORT TERM GOALS: Target date: 04/30/2024  Pt will be independent with administered HEP to demonstrate the competency necessary for [place] term managemnet of symptoms at home.  Baseline: Goal status: INITIAL   [place] TERM GOALS: Target date: 05/21/2024  Pt. Will achieve a DASH score of 21 as to demonstrate improvement in self-perceived functional ability with daily activities.  Baseline: 31 (45.5%) Goal status: INITIAL  2.  Pt will report pain levels improving during ADLs to be less than or equal to 2/10 as to demonstrate improved tolerance with daily functional activities such as cleaning or driving. Baseline: 3/10 Goal status: INITIAL  3.  Pt will improve MMT score for global shoulder strength to a 4+/5 to demonstrate improvement in strength for quality of motion and activity performance.  Baseline: see objective chart Goal status: INITIAL  4.  Pt will improve AROM R shoulder Abduction to 160d to demonstrate improvement in quality of motion and overhead mobility necessary for ADL performance Baseline: see objective chart Goal status: INITIAL ---------------------------------------------------------------------------------------------  PLAN: PT FREQUENCY: 1-2x/week  PT DURATION: 6 weeks  PLANNED INTERVENTIONS: 97110-Therapeutic exercises, 97530- Therapeutic activity, 97112- Neuromuscular re-education, 97535- Self Care, 02859-  Manual therapy, Patient/Family education, Taping, Joint mobilization, DME instructions, Cryotherapy, and Moist heat  PLAN FOR NEXT SESSION: Review HEP, Begin POC as detailed in assessment  [name], PT, DPT 04/09/2024, 11:21 AM  For all possible CPT codes, reference the Planned Interventions line above.     Check all conditions that are expected to impact treatment: {Conditions expected to impact treatment:None of these apply

## 2024-04-10 ENCOUNTER — Telehealth: Payer: Self-pay

## 2024-04-10 NOTE — Telephone Encounter (Signed)
 Copied from CRM [id number]. Topic: Clinical - Prescription Issue >> Apr 09, 2024  1:31 PM DeAngela L wrote: Reason for CRM: patient would like to update her pharmacy to the  [facility]  Address: [address],  [place], [place] [address] Phone: [phone]

## 2024-04-10 NOTE — Telephone Encounter (Signed)
 [facility] [address],, [place] [address]Phone: [phone] per patient requested

## 2024-04-12 ENCOUNTER — Telehealth: Payer: Self-pay | Admitting: Family

## 2024-04-12 NOTE — Telephone Encounter (Signed)
 Copied from CRM [id number]. Topic: Referral - Status >> Apr 11, 2024  5:14 PM Zebedee SAUNDERS wrote: Reason for CRM: Pt stated location for bone density does not provide imaging, please redirect referral.

## 2024-04-17 ENCOUNTER — Ambulatory Visit: Payer: Self-pay | Admitting: Internal Medicine

## 2024-04-18 ENCOUNTER — Ambulatory Visit

## 2024-04-19 ENCOUNTER — Encounter

## 2024-04-25 ENCOUNTER — Ambulatory Visit: Payer: Self-pay

## 2024-05-06 NOTE — Progress Notes (Unsigned)
 Cardiology Office Note:  .    Date:  05/07/2024  ID:  [name], DOB 25-Aug-1960, MRN [id number] PCP: Tiffany Greig PARAS, NP  [facility] Providers Cardiologist:  Stanly DELENA Leavens, MD     CC: CT f/u Consulted for the evaluation of CAC at the behest of Mrs. Tiffany   History of Present Illness: .    [name] is a 63 y.o. female with hypertension, hyperlipidemia, diabetes, and aortic atherosclerosis who presents for cardiovascular management and smoking cessation.  She has a history of hypertension, hyperlipidemia, diabetes, and aortic atherosclerosis. Despite previous attempts to quit smoking, including using nicotine patches for about a week, she continues to smoke. She had successfully quit smoking for a year around 2001 but resumed thereafter.  She experiences occasional dizziness upon standing, necessitating support against a wall. No chest pain, chest pressure, chest tightness, shortness of breath, or episodes of syncope are reported.  Her family history is notable for heart problems; her brother has had stents placed and has antiphospholipid syndrome. He was a former smoker but has since quit.  She is not currently engaging in regular physical activity and acknowledges a lack of physical exercise at present. with aortic atherosclerosis, CAC and HLD, with hx of smoking, HTN and DM  Discussed the use of AI scribe software for clinical note transcription with the patient, who gave verbal consent to proceed.   Relevant histories: .  Social  - Tobacco: Current smoker (quit for a year around 2001), 46.0 years. Patient has attempted to quit using nicotine patches. ROS: As per HPI.   Studies Reviewed: SABRA    RADIOLOGY CT lung scan: Aortic atherosclerosis, coronary artery calcifications in left anterior descending artery, circumflex artery, and right coronary artery  DIAGNOSTIC EKG: Normal (05/07/2024)   Physical Exam:    VS:  BP 104/72   Pulse 78   Ht 5'  9 (1.753 m)   Wt 201 lb (91.2 kg)   SpO2 95%   BMI 29.68 kg/m    Wt Readings from Last 3 Encounters:  05/07/24 201 lb (91.2 kg)  03/12/24 195 lb (88.5 kg)  03/07/24 198 lb (89.8 kg)    Gen: no distress  Neck: No JVD Cardiac: No Rubs or Gallops, no Murmur, RRR +2 radial pulses Respiratory: Clear to auscultation bilaterally, normal effort, normal  respiratory rate GI: Soft, nontender, non-distended  MS: No  edema;  moves all extremities Integument: Skin feels warm Neuro:  At time of evaluation, alert and oriented to person/place/time/situation  Psych: Normal affect, patient feels ok   ASSESSMENT AND PLAN: .    Aortic atherosclerosis and coronary artery calcification Aortic atherosclerosis and coronary artery calcification identified on CT scan. No current symptoms of chest pain, pressure, or tightness. No evidence of obstructive coronary artery disease. Risk factors include hypertension, hyperlipidemia, diabetes, and current tobacco use. Emphasized secondary prevention and lifestyle modifications to reduce cardiovascular risk. - Encourage increased physical activity, aiming for 150 minutes of moderate exercise per week. - Monitor for symptoms such as chest pain or pressure during physical activity. - Order ischemic testing if symptoms develop during exercise. - Check cholesterol levels in 3 months. - Order lipoprotein(a) test to assess cardiovascular risk.  Hypertension Hypertension is well controlled. Blood pressure management is crucial given the presence of aortic atherosclerosis and coronary artery calcification. Potential to reduce medication burden with successful smoking cessation. - Continue current antihypertensive regimen. - Monitor blood pressure regularly.  Hyperlipidemia Cholesterol levels are at the upper limits  of normal. Aim to improve cholesterol levels through lifestyle modifications and potentially medication adjustments if necessary. - Encouraged lifestyle  modifications including increased physical activity. - Reassess cholesterol levels in 3 months.  Nicotine dependence Current tobacco use with a smoking history of 46 years. Previous attempt to quit using nicotine patches was temporarily successful. Discussed benefits of smoking cessation, including potential reduction in medication burden and improved cardiovascular health. - Offered nicotine patches to aid in smoking cessation. - Provided information on free smoking cessation programs through Medicaid. - Encouraged consideration of smoking cessation options and to reach out if interested.  One year with me or my team  Stanly Leavens, MD FASE [facility] Cardiologist [facility]  [address] [place], [address] [place], [place] [address] [phone]  2:15 PM

## 2024-05-07 ENCOUNTER — Ambulatory Visit: Admitting: Internal Medicine

## 2024-05-07 ENCOUNTER — Encounter: Payer: Self-pay | Admitting: Internal Medicine

## 2024-05-07 VITALS — BP 104/72 | HR 78 | Ht 69.0 in | Wt 201.0 lb

## 2024-05-07 DIAGNOSIS — I7090 Unspecified atherosclerosis: Secondary | ICD-10-CM | POA: Insufficient documentation

## 2024-05-07 DIAGNOSIS — Z720 Tobacco use: Secondary | ICD-10-CM | POA: Insufficient documentation

## 2024-05-07 DIAGNOSIS — I2510 Atherosclerotic heart disease of native coronary artery without angina pectoris: Secondary | ICD-10-CM | POA: Insufficient documentation

## 2024-05-07 NOTE — Patient Instructions (Signed)
 Medication Instructions:  Your physician recommends that you continue on your current medications as directed. Please refer to the Current Medication list given to you today.  *If you need a refill on your cardiac medications before your next appointment, please call your pharmacy*  Lab Work: IN 3 MONTHS at any [facility]: fasting lipid panel and Lpa  If you have labs (blood work) drawn today and your tests are completely normal, you will receive your results only by: MyChart Message (if you have MyChart) OR A paper copy in the mail If you have any lab test that is abnormal or we need to change your treatment, we will call you to review the results.  Testing/Procedures: NONE   Follow-Up: At [facility], you and your health needs are our priority.  As part of our continuing mission to provide you with exceptional heart care, our providers are all part of one team.  This team includes your primary Cardiologist (physician) and Advanced Practice Providers or APPs (Physician Assistants and Nurse Practitioners) who all work together to provide you with the care you need, when you need it.  Your next appointment:   1 year(s)  Provider:   Stanly DELENA Leavens, MD

## 2024-05-09 ENCOUNTER — Other Ambulatory Visit: Payer: Self-pay | Admitting: Family

## 2024-05-09 DIAGNOSIS — E1165 Type 2 diabetes mellitus with hyperglycemia: Secondary | ICD-10-CM

## 2024-05-09 NOTE — Telephone Encounter (Signed)
 Copied from CRM [id number]. Topic: Clinical - Medication Refill >> May 09, 2024 12:30 PM Rosaria E wrote: Medication: JARDIANCE  10 MG TABS tablet  Has the patient contacted their pharmacy? Yes (Agent: If no, request that the patient contact the pharmacy for the refill. If patient does not wish to contact the pharmacy document the reason why and proceed with request.) (Agent: If yes, when and what did the pharmacy advise?)  This is the patient's preferred pharmacy:  [id number] [place] [address], [place] - [address] [place] [address] Phone: [phone] Fax: [phone]  Is this the correct pharmacy for this prescription? Yes If no, delete pharmacy and type the correct one.   Has the prescription been filled recently? Yes  Is the patient out of the medication? No.  Has the patient been seen for an appointment in the last year OR does the patient have an upcoming appointment? Yes  Can we respond through MyChart? Yes  Agent: Please be advised that Rx refills may take up to 3 business days. We ask that you follow-up with your pharmacy.

## 2024-05-10 MED ORDER — JARDIANCE 10 MG PO TABS: 10.0000 mg | ORAL_TABLET | Freq: Every day | ORAL | 0 refills | Status: DC

## 2024-05-10 NOTE — Telephone Encounter (Signed)
 Requested Prescriptions  Pending Prescriptions Disp Refills   JARDIANCE  10 MG TABS tablet 90 tablet 0    Sig: Take 1 tablet (10 mg total) by mouth daily.     Endocrinology:  Diabetes - SGLT2 Inhibitors Passed - 05/10/2024  2:17 PM      Passed - Cr in normal range and within 360 days    Creatinine, Ser  Date Value Ref Range Status  03/12/2024 0.93 0.57 - 1.00 mg/dL Final         Passed - HBA1C is between 0 and 7.9 and within 180 days    Hgb A1c MFr Bld  Date Value Ref Range Status  03/12/2024 5.9 (H) 4.8 - 5.6 % Final    Comment:             Prediabetes: 5.7 - 6.4          Diabetes: >6.4          Glycemic control for adults with diabetes: <7.0          Passed - eGFR in normal range and within 360 days    GFR calc Af Amer  Date Value Ref Range Status  09/28/2016 >60 >60 mL/min Final    Comment:    (NOTE) The eGFR has been calculated using the CKD EPI equation. This calculation has not been validated in all clinical situations. eGFR's persistently <60 mL/min signify possible Chronic Kidney Disease.    GFR, Estimated  Date Value Ref Range Status  01/23/2023 >60 >60 mL/min Final    Comment:    (NOTE) Calculated using the CKD-EPI Creatinine Equation (2021)    eGFR  Date Value Ref Range Status  03/12/2024 70 >59 mL/min/1.73 Final         Passed - Valid encounter within last 6 months    Recent Outpatient Visits           1 month ago Primary hypertension   [place] [facility], [place], NP   7 months ago Annual physical exam   [facility], Amy J, NP   9 months ago Acute cough   [place] [facility], MD   10 months ago Encounter to establish care   [facility] at [facility], [place], NP       Future Appointments             In 1 month Tiffany Greig PARAS, NP [facility] at [facility] [address]

## 2024-06-07 ENCOUNTER — Other Ambulatory Visit: Payer: Self-pay | Admitting: Family

## 2024-06-07 DIAGNOSIS — E1165 Type 2 diabetes mellitus with hyperglycemia: Secondary | ICD-10-CM

## 2024-06-12 ENCOUNTER — Ambulatory Visit (INDEPENDENT_AMBULATORY_CARE_PROVIDER_SITE_OTHER): Admitting: Family

## 2024-06-12 ENCOUNTER — Encounter: Payer: Self-pay | Admitting: Family

## 2024-06-12 VITALS — BP 106/70 | HR 80 | Temp 98.5°F | Resp 16 | Ht 69.0 in | Wt 202.2 lb

## 2024-06-12 DIAGNOSIS — Z23 Encounter for immunization: Secondary | ICD-10-CM

## 2024-06-12 DIAGNOSIS — E1165 Type 2 diabetes mellitus with hyperglycemia: Secondary | ICD-10-CM | POA: Diagnosis not present

## 2024-06-12 DIAGNOSIS — Z7984 Long term (current) use of oral hypoglycemic drugs: Secondary | ICD-10-CM

## 2024-06-12 DIAGNOSIS — I10 Essential (primary) hypertension: Secondary | ICD-10-CM | POA: Diagnosis not present

## 2024-06-12 DIAGNOSIS — E785 Hyperlipidemia, unspecified: Secondary | ICD-10-CM | POA: Diagnosis not present

## 2024-06-12 MED ORDER — ROSUVASTATIN CALCIUM 10 MG PO TABS
10.0000 mg | ORAL_TABLET | Freq: Every day | ORAL | 0 refills | Status: AC
Start: 2024-06-12 — End: ?

## 2024-06-12 MED ORDER — LOSARTAN POTASSIUM-HCTZ 50-12.5 MG PO TABS: 1.0000 | ORAL_TABLET | Freq: Every day | ORAL | 0 refills | Status: AC

## 2024-06-12 MED ORDER — METFORMIN HCL ER 500 MG PO TB24: 500.0000 mg | ORAL_TABLET | Freq: Two times a day (BID) | ORAL | 0 refills | Status: AC

## 2024-06-12 MED ORDER — JARDIANCE 10 MG PO TABS: 10.0000 mg | ORAL_TABLET | Freq: Every day | ORAL | 0 refills | Status: AC

## 2024-06-12 NOTE — Addendum Note (Signed)
 Addended by: FRANCHOT PURVIS CROME on: 06/12/2024 11:28 AM   Modules accepted: Orders

## 2024-06-12 NOTE — Progress Notes (Signed)
 3 month follow-up

## 2024-06-12 NOTE — Progress Notes (Signed)
 Patient ID: [name], female    DOB: Nov 30, 1960  MRN: [id number]  CC: Chronic Conditions Follow-Up  Subjective: [name] is a 63 y.o. female who presents for chronic conditions follow-up.   Her concerns today include:  - Doing well on Losartan -Hydrochlorothiazide, no issues/concerns. She does not complain of red flag symptoms such as but not limited to chest pain, shortness of breath, worst headache of life, nausea/vomiting.  - Doing well on Metformin  XR and Jardiance , no issues/concerns. She is trying to watch what she eats. She does not exercise. Denies red flag symptoms associated with diabetes.  - Doing well on Rosuvastatin , no issues/concerns.   Patient Active Problem List   Diagnosis Date Noted   Renal stone 09/27/2016     Current Outpatient Medications on File Prior to Visit  Medication Sig Dispense Refill   AUSTEDO XR 12 MG TB24 Take 1 tablet by mouth daily.     clonazePAM  (KLONOPIN ) 2 MG tablet Take 1 tablet by mouth at bedtime.     EPINEPHrine  0.3 mg/0.3 mL IJ SOAJ injection Inject 0.3 mg into the muscle as needed for anaphylaxis. 2 each 2   gabapentin (NEURONTIN) 100 MG capsule Take 100 mg by mouth at bedtime.     ibuprofen  (ADVIL ,MOTRIN ) 200 MG tablet Take 200 mg by mouth every 6 (six) hours as needed.     PARoxetine  (PAXIL ) 40 MG tablet Take 40 mg by mouth every morning.      QUEtiapine (SEROQUEL) 50 MG tablet Take 50 mg by mouth at bedtime.     lurasidone (LATUDA) 20 MG TABS tablet 20 mg daily at 6 (six) AM.     No current facility-administered medications on file prior to visit.    Allergies  Allergen Reactions   Sulfa Antibiotics Rash    Social History   Socioeconomic History   Marital status: Single    Spouse name: Not on file   Number of children: Not on file   Years of education: Not on file   Highest education level: Associate degree: academic program  Occupational History   Not on file  Tobacco Use   Smoking status: Every Day    Current  packs/day: 0.50    Average packs/day: 1 pack/day for 46.8 years (45.4 ttl pk-yrs)    Types: Cigarettes    Start date: 1979   Smokeless tobacco: Never  Vaping Use   Vaping status: Never Used  Substance and Sexual Activity   Alcohol use: Yes    Comment: occasionally   Drug use: No   Sexual activity: Not on file  Other Topics Concern   Not on file  Social History Narrative   Not on file   Social Drivers of Health   Financial Resource Strain: Low Risk  (06/11/2024)   Overall Financial Resource Strain (CARDIA)    Difficulty of Paying Living Expenses: Not very hard  Food Insecurity: Food Insecurity Present (06/11/2024)   Hunger Vital Sign    Worried About Running Out of Food in the Last Year: Sometimes true    Ran Out of Food in the Last Year: Never true  Transportation Needs: No Transportation Needs (06/11/2024)   PRAPARE - Administrator, Civil Service (Medical): No    Lack of Transportation (Non-Medical): No  Physical Activity: Inactive (06/11/2024)   Exercise Vital Sign    Days of Exercise per Week: 0 days    Minutes of Exercise per Session: Not on file  Stress: Stress Concern Present (06/11/2024)  [employer] of Occupational Health - Occupational Stress Questionnaire    Feeling of Stress: Rather much  Social Connections: Socially Isolated (06/11/2024)   Social Connection and Isolation Panel    Frequency of Communication with Friends and Family: Three times a week    Frequency of Social Gatherings with Friends and Family: More than three times a week    Attends Religious Services: Never    Database Administrator or Organizations: No    Attends Engineer, Structural: Not on file    Marital Status: Divorced  Intimate Partner Violence: Unknown (01/25/2022)   Received from [facility]   HITS    Physically Hurt: Not on file    Insult or Talk Down To: Not on file    Threaten Physical Harm: Not on file    Scream or Curse: Not on file    Family History   Problem Relation Age of Onset   COPD Mother    Emphysema Mother    Lung cancer Mother        smoked   Mesothelioma Father        smoked   Emphysema Maternal Grandfather    COPD Maternal Grandfather     Past Surgical History:  Procedure Laterality Date   CARPAL TUNNEL RELEASE Right 1996   CESAREAN SECTION  1992   CLAVICLE SURGERY Right 11/2023   CYSTOSCOPY WITH URETEROSCOPY Left 10/19/2016   Procedure: CYSTOSCOPY WITH LEFT  URETEROSCOPY WITH URETERAL STENT REMOVAL;  Surgeon: Morene LELON Salines, MD;  Location: [facility];  Service: Urology;  Laterality: Left;   LUMBAR MICRODISCECTOMY  1995   L4 -- L5   NEPHROLITHOTOMY Left 09/27/2016   Procedure: NEPHROLITHOTOMY PERCUTANEOUS WITH SURGEON ACCESS;  Surgeon: Morene LELON Salines, MD;  Location: [facility];  Service: Urology;  Laterality: Left;   RECONSTRUCTION OF CORACOCLAVICULAR LIGAMENT Left 01/27/2023   Procedure: RECONSTRUCTION OF ACROMIOCLAVICULAR WITH HAMSTRING ALLOGRAFT SEMI-T;  Surgeon: Sharl Selinda Dover, MD;  Location: [facility];  Service: Orthopedics;  Laterality: Left;  120   SHOULDER ARTHROSCOPY Left 01/27/2023   Procedure: ARTHROSCOPY SHOULDER;  Surgeon: Sharl Selinda Dover, MD;  Location: [facility];  Service: Orthopedics;  Laterality: Left;  120   TOTAL ABDOMINAL HYSTERECTOMY W/ BILATERAL SALPINGOOPHORECTOMY  02/06/2009   TUBAL LIGATION Bilateral 1995    ROS: Review of Systems Negative except as stated above  PHYSICAL EXAM: BP 106/70   Pulse 80   Temp 98.5 F (36.9 C) (Oral)   Resp 16   Ht 5' 9 (1.753 m)   Wt 202 lb 3.2 oz (91.7 kg)   SpO2 96%   BMI 29.86 kg/m   Physical Exam HENT:     Head: Normocephalic and atraumatic.     Nose: Nose normal.     Mouth/Throat:     Mouth: Mucous membranes are moist.     Pharynx: Oropharynx is clear.  Eyes:     Extraocular Movements: Extraocular movements intact.     Conjunctiva/sclera: Conjunctivae normal.     Pupils:  Pupils are equal, round, and reactive to light.  Cardiovascular:     Rate and Rhythm: Normal rate and regular rhythm.     Pulses: Normal pulses.     Heart sounds: Normal heart sounds.  Pulmonary:     Effort: Pulmonary effort is normal.     Breath sounds: Normal breath sounds.  Musculoskeletal:        General: Normal range of motion.     Cervical back: Normal  range of motion and neck supple.  Neurological:     General: No focal deficit present.     Mental Status: She is alert and oriented to person, place, and time.  Psychiatric:        Mood and Affect: Mood normal.        Behavior: Behavior normal.     ASSESSMENT AND PLAN: 1. Primary hypertension (Primary) - Continue Losartan -Hydrochlorothiazide as prescribed.  - Counseled on blood pressure goal of less than 130/80, low-sodium, DASH diet, medication compliance, and 150 minutes of moderate intensity exercise per week as tolerated. Counseled on medication adherence and adverse effects. - Follow-up with primary provider in 3 months or sooner if needed.  - losartan -hydrochlorothiazide (HYZAAR) 50-12.5 MG tablet; Take 1 tablet by mouth daily.  Dispense: 90 tablet; Refill: 0  2. Type 2 diabetes mellitus with hyperglycemia, without [place]-term current use of insulin (HCC) - Hemoglobin A1c result pending. - Continue Metformin  XR and Jardiance  as prescribed.  - Routine screening.  - Discussed the importance of healthy eating habits, low-carbohydrate diet, low-sugar diet, regular aerobic exercise (at least 150 minutes a week as tolerated) and medication compliance to achieve or maintain control of diabetes. Counseled on medication adherence/adverse effects.  - Follow-up with primary provider as scheduled.  - Microalbumin / creatinine urine ratio - Hemoglobin A1c - metFORMIN  (GLUCOPHAGE -XR) 500 MG 24 hr tablet; Take 1 tablet (500 mg total) by mouth in the morning and at bedtime.  Dispense: 180 tablet; Refill: 0 - JARDIANCE  10 MG TABS tablet;  Take 1 tablet (10 mg total) by mouth daily.  Dispense: 90 tablet; Refill: 0  3. Hyperlipidemia, unspecified hyperlipidemia type - Continue Rosuvastatin  as prescribed. Counseled on medication adherence/adverse effects.  - Follow-up with primary provider in 3 months or sooner if needed.  - rosuvastatin  (CRESTOR ) 10 MG tablet; Take 1 tablet (10 mg total) by mouth daily.  Dispense: 90 tablet; Refill: 0   Patient was given the opportunity to ask questions.  Patient verbalized understanding of the plan and was able to repeat key elements of the plan. Patient was given clear instructions to go to Emergency Department or return to medical center if symptoms don't improve, worsen, or new problems develop.The patient verbalized understanding.   Orders Placed This Encounter  Procedures   Microalbumin / creatinine urine ratio   Hemoglobin A1c     Requested Prescriptions   Signed Prescriptions Disp Refills   losartan -hydrochlorothiazide (HYZAAR) 50-12.5 MG tablet 90 tablet 0    Sig: Take 1 tablet by mouth daily.   rosuvastatin  (CRESTOR ) 10 MG tablet 90 tablet 0    Sig: Take 1 tablet (10 mg total) by mouth daily.   metFORMIN  (GLUCOPHAGE -XR) 500 MG 24 hr tablet 180 tablet 0    Sig: Take 1 tablet (500 mg total) by mouth in the morning and at bedtime.   JARDIANCE  10 MG TABS tablet 90 tablet 0    Sig: Take 1 tablet (10 mg total) by mouth daily.    Return in about 3 months (around 09/12/2024) for Follow-Up or next available chronic conditions.  Greig JINNY Drones, NP

## 2024-06-13 ENCOUNTER — Ambulatory Visit: Payer: Self-pay | Admitting: Family

## 2024-06-13 LAB — HEMOGLOBIN A1C
Est. average glucose Bld gHb Est-mCnc: 120 mg/dL
Hgb A1c MFr Bld: 5.8 % — ABNORMAL HIGH (ref 4.8–5.6)

## 2024-06-13 LAB — MICROALBUMIN / CREATININE URINE RATIO
Creatinine, Urine: 42.1 mg/dL
Microalb/Creat Ratio: 49 mg/g{creat} — ABNORMAL HIGH (ref 0–29)
Microalbumin, Urine: 20.7 ug/mL

## 2024-07-17 ENCOUNTER — Ambulatory Visit: Payer: Self-pay

## 2024-07-17 NOTE — Telephone Encounter (Signed)
 FYI Only or Action Required?: Action required by provider: clinical question for provider.  Patient was last seen in primary care on 06/12/2024 by Jaycee Greig PARAS, NP.  Called Nurse Triage reporting Cough.  Symptoms began 2 weeks.  Interventions attempted: Nothing.  Symptoms are: gradually worsening.  Triage Disposition: See PCP When Office is Open (Within 3 Days)  Patient/caregiver understands and will follow disposition?:     Copied from CRM #[id number]. Topic: Clinical - Red Word Triage >> Jul 17, 2024  9:52 AM Treva T wrote: [facility] that prompted transfer to Nurse Triage: Pt calling states she has increased chest and head congestion, hurts to cough, has a productive cough. States she is coughing so hard she looses bladder control.  Pt is requesting an appt for evaluation.  Ph. [phone] Reason for Disposition  Cough has been present for > 3 weeks  Answer Assessment - Initial Assessment Questions 1. ONSET: When did the cough begin?      2 weeks 2. SEVERITY: How bad is the cough today?      moderate 3. SPUTUM: Describe the color of your sputum (e.g., none, dry cough; clear, white, yellow, green)     clear 4. HEMOPTYSIS: Are you coughing up any blood? If Yes, ask: How much? (e.g., flecks, streaks, tablespoons, etc.)     no 5. DIFFICULTY BREATHING: Are you having difficulty breathing? If Yes, ask: How bad is it? (e.g., mild, moderate, severe)      mild 6. FEVER: Do you have a fever? If Yes, ask: What is your temperature, how was it measured, and when did it start?     no 7. CARDIAC HISTORY: Do you have any history of heart disease? (e.g., heart attack, congestive heart failure)      na 8. LUNG HISTORY: Do you have any history of lung disease?  (e.g., pulmonary embolus, asthma, emphysema)     na 9. PE RISK FACTORS: Do you have a history of blood clots? (or: recent major surgery, recent prolonged travel, bedridden)     na 10. OTHER SYMPTOMS: Do you  have any other symptoms? (e.g., runny nose, wheezing, chest pain)       Coughing so much that losing control of bladder, head and chest congestion, stuffy nose  11. PREGNANCY: Is there any chance you are pregnant? When was your last menstrual period?       na 12. TRAVEL: Have you traveled out of the country in the last month? (e.g., travel history, exposures)       Na  Offered pt appt: pt refused at present time: stated she would like to request PCP sent something in to help with cough like Tessalon  pearls or tussinex.  Pt states been sick for about 2 weeks with stuffy/congested head and congestion in chest: cough is getting worse to the point she is urinating on self when coughing.  Protocols used: Cough - Acute Productive-A-AH

## 2024-07-19 NOTE — Telephone Encounter (Signed)
 Report to Emergency Department/Urgent Care/call 911 for immediate medical evaluation. Follow-up with Primary Care.

## 2024-07-19 NOTE — Telephone Encounter (Signed)
 I called patient with pcp recommendations.  Patient stated she has an appointment on Monday

## 2024-07-22 ENCOUNTER — Ambulatory Visit: Admitting: Family Medicine

## 2024-08-13 ENCOUNTER — Telehealth: Payer: Self-pay

## 2024-08-13 NOTE — Telephone Encounter (Signed)
 Copied from CRM [id number]. Topic: Clinical - Medical Advice >> Aug 13, 2024  2:33 PM Hadassah PARAS wrote: Reason for CRM: Pt would like to know if PCP conducts ear irrigation.Please advise pt on #[phone]

## 2024-08-14 NOTE — Telephone Encounter (Signed)
 FYI   Patient is concern about his mediation that was prescribe. Patient said he has fallen,has weakness,dizziness and loss of balance. Patient is still taking medication and would like to know what he should do. Please advise

## 2024-08-15 NOTE — Telephone Encounter (Signed)
 Patient's last office visit 06/12/2024 for chronic conditions (refer to office visit note for additional details). Upon chart review I do not see where patient had a recent ear irrigation or recent medication prescribed. Report to the Emergency Department/Urgent Care/call 911 for immediate medical evaluation. Follow-up with Primary Care.

## 2024-08-15 NOTE — Telephone Encounter (Signed)
 I called patient with pcp recommendationand patient stated they have a appointment on 09/05/2024

## 2024-08-15 NOTE — Telephone Encounter (Signed)
 Please disregard FYI

## 2024-08-20 ENCOUNTER — Ambulatory Visit

## 2024-08-20 VITALS — BP 105/71 | HR 79 | Temp 98.4°F | Resp 16 | Wt 206.0 lb

## 2024-08-20 DIAGNOSIS — H6122 Impacted cerumen, left ear: Secondary | ICD-10-CM | POA: Diagnosis not present

## 2024-08-20 NOTE — Progress Notes (Unsigned)
" ° ° ° °  Patient ID: [name], female    DOB: 08/28/1960  MRN: [id number]  CC: Medical Management of Chronic Issues (Patient said her ears has been stopped up ever since she was sick 6 wks ago with chest cold)   Subjective: [name] is a 64 y.o. female with past medical history of *** who presents to clinic for 6 weeks ago, left worse than right.    Allergies[1]  ROS: Review of Systems Negative except as stated above  PHYSICAL EXAM: BP 105/71   Pulse 79   Temp 98.4 F (36.9 C) (Oral)   Resp 16   Wt 206 lb (93.4 kg)   SpO2 95%   BMI 30.42 kg/m   Physical Exam  General: well-appearing, no acute distress Skin: no jaundice, rashes, or lesions Cardiovascular: regular heart rate and rhythm, normal S1/S2, no murmurs, gallops, or rubs, peripheral pulses 2+ bilaterally Chest: no skeletal deformity, lungs clear to auscultation bilaterally, equal breath sounds bilaterally Abdomen: soft, non-distended, non-tender to palpation, no hepatomegaly, no splenomegaly, normoactive bowel sounds Musculoskeletal: normal gait Extremities: no peripheral edema  ASSESSMENT AND PLAN:  There are no diagnoses linked to this encounter.   Patient was given the opportunity to ask questions.  Patient verbalized understanding of the plan and was able to repeat key elements of the plan.    No orders of the defined types were placed in this encounter.    Requested Prescriptions    No prescriptions requested or ordered in this encounter    No follow-ups on file.  Sula Cower Javana Schey, PA-C     [1]  Allergies Allergen Reactions   Sulfa Antibiotics Rash   "

## 2024-09-05 ENCOUNTER — Ambulatory Visit: Payer: Self-pay

## 2024-09-12 ENCOUNTER — Ambulatory Visit: Admitting: Family
